# Patient Record
Sex: Female | Born: 1950 | Race: White | Hispanic: No | Marital: Married | Smoking: Never smoker
Health system: Southern US, Community
[De-identification: ages and names within clinical notes are randomized; demographics above are authoritative.]

## PROBLEM LIST (undated history)

## (undated) DIAGNOSIS — IMO0002 Reserved for concepts with insufficient information to code with codable children: Secondary | ICD-10-CM

## (undated) DIAGNOSIS — K219 Gastro-esophageal reflux disease without esophagitis: Secondary | ICD-10-CM

## (undated) DIAGNOSIS — M329 Systemic lupus erythematosus, unspecified: Secondary | ICD-10-CM

## (undated) DIAGNOSIS — E039 Hypothyroidism, unspecified: Secondary | ICD-10-CM

## (undated) DIAGNOSIS — I8500 Esophageal varices without bleeding: Secondary | ICD-10-CM

## (undated) DIAGNOSIS — K7460 Unspecified cirrhosis of liver: Secondary | ICD-10-CM

## (undated) DIAGNOSIS — F419 Anxiety disorder, unspecified: Secondary | ICD-10-CM

## (undated) HISTORY — PX: HEEL SPUR SURGERY: SHX665

## (undated) HISTORY — DX: Anxiety disorder, unspecified: F41.9

## (undated) HISTORY — DX: Gastro-esophageal reflux disease without esophagitis: K21.9

## (undated) HISTORY — PX: CHOLECYSTECTOMY: SHX55

## (undated) HISTORY — PX: ABDOMINAL HYSTERECTOMY: SHX81

## (undated) HISTORY — PX: TONSILLECTOMY: SUR1361

## (undated) HISTORY — PX: HERNIA REPAIR: SHX51

---

## 1998-01-27 ENCOUNTER — Other Ambulatory Visit: Admission: RE | Admit: 1998-01-27 | Discharge: 1998-01-27 | Payer: Self-pay | Admitting: Obstetrics and Gynecology

## 1999-02-09 ENCOUNTER — Encounter: Admission: RE | Admit: 1999-02-09 | Discharge: 1999-02-09 | Payer: Self-pay | Admitting: *Deleted

## 1999-03-11 ENCOUNTER — Other Ambulatory Visit: Admission: RE | Admit: 1999-03-11 | Discharge: 1999-03-11 | Payer: Self-pay | Admitting: Obstetrics and Gynecology

## 1999-03-23 ENCOUNTER — Encounter: Admission: RE | Admit: 1999-03-23 | Discharge: 1999-03-23 | Payer: Self-pay | Admitting: *Deleted

## 2000-02-11 ENCOUNTER — Encounter: Admission: RE | Admit: 2000-02-11 | Discharge: 2000-02-11 | Payer: Self-pay | Admitting: Obstetrics and Gynecology

## 2000-02-11 ENCOUNTER — Encounter: Payer: Self-pay | Admitting: Obstetrics and Gynecology

## 2000-03-14 ENCOUNTER — Other Ambulatory Visit: Admission: RE | Admit: 2000-03-14 | Discharge: 2000-03-14 | Payer: Self-pay | Admitting: Obstetrics and Gynecology

## 2000-11-09 ENCOUNTER — Encounter: Payer: Self-pay | Admitting: Gastroenterology

## 2000-11-09 ENCOUNTER — Ambulatory Visit (HOSPITAL_COMMUNITY): Admission: RE | Admit: 2000-11-09 | Discharge: 2000-11-09 | Payer: Self-pay | Admitting: Gastroenterology

## 2001-03-20 ENCOUNTER — Other Ambulatory Visit: Admission: RE | Admit: 2001-03-20 | Discharge: 2001-03-20 | Payer: Self-pay | Admitting: Obstetrics and Gynecology

## 2002-03-27 ENCOUNTER — Ambulatory Visit (HOSPITAL_COMMUNITY): Admission: RE | Admit: 2002-03-27 | Discharge: 2002-03-27 | Payer: Self-pay | Admitting: Obstetrics and Gynecology

## 2002-03-27 ENCOUNTER — Encounter: Payer: Self-pay | Admitting: Obstetrics and Gynecology

## 2002-04-26 ENCOUNTER — Encounter: Payer: Self-pay | Admitting: Obstetrics and Gynecology

## 2002-04-26 ENCOUNTER — Encounter: Admission: RE | Admit: 2002-04-26 | Discharge: 2002-04-26 | Payer: Self-pay | Admitting: Geriatric Medicine

## 2002-12-05 ENCOUNTER — Ambulatory Visit (HOSPITAL_COMMUNITY): Admission: RE | Admit: 2002-12-05 | Discharge: 2002-12-05 | Payer: Self-pay | Admitting: Obstetrics and Gynecology

## 2003-06-26 ENCOUNTER — Encounter (INDEPENDENT_AMBULATORY_CARE_PROVIDER_SITE_OTHER): Payer: Self-pay | Admitting: *Deleted

## 2003-06-27 ENCOUNTER — Inpatient Hospital Stay (HOSPITAL_COMMUNITY): Admission: RE | Admit: 2003-06-27 | Discharge: 2003-06-28 | Payer: Self-pay | Admitting: Obstetrics and Gynecology

## 2003-10-28 ENCOUNTER — Encounter: Admission: RE | Admit: 2003-10-28 | Discharge: 2003-10-28 | Payer: Self-pay | Admitting: Geriatric Medicine

## 2005-02-10 ENCOUNTER — Encounter: Admission: RE | Admit: 2005-02-10 | Discharge: 2005-02-10 | Payer: Self-pay | Admitting: Obstetrics and Gynecology

## 2006-02-14 ENCOUNTER — Encounter: Admission: RE | Admit: 2006-02-14 | Discharge: 2006-02-14 | Payer: Self-pay | Admitting: Obstetrics and Gynecology

## 2007-03-02 ENCOUNTER — Encounter: Admission: RE | Admit: 2007-03-02 | Discharge: 2007-03-02 | Payer: Self-pay | Admitting: Obstetrics and Gynecology

## 2008-03-11 ENCOUNTER — Encounter: Admission: RE | Admit: 2008-03-11 | Discharge: 2008-03-11 | Payer: Self-pay | Admitting: Geriatric Medicine

## 2008-06-05 ENCOUNTER — Encounter (INDEPENDENT_AMBULATORY_CARE_PROVIDER_SITE_OTHER): Payer: Self-pay | Admitting: General Surgery

## 2008-06-05 ENCOUNTER — Ambulatory Visit (HOSPITAL_COMMUNITY): Admission: RE | Admit: 2008-06-05 | Discharge: 2008-06-05 | Payer: Self-pay | Admitting: General Surgery

## 2008-08-19 ENCOUNTER — Encounter: Admission: RE | Admit: 2008-08-19 | Discharge: 2008-08-19 | Payer: Self-pay | Admitting: Geriatric Medicine

## 2008-11-25 ENCOUNTER — Encounter: Payer: Self-pay | Admitting: Gastroenterology

## 2009-02-10 ENCOUNTER — Encounter: Payer: Self-pay | Admitting: Gastroenterology

## 2009-02-25 ENCOUNTER — Telehealth: Payer: Self-pay | Admitting: Gastroenterology

## 2009-02-27 ENCOUNTER — Encounter (INDEPENDENT_AMBULATORY_CARE_PROVIDER_SITE_OTHER): Payer: Self-pay | Admitting: *Deleted

## 2009-02-27 ENCOUNTER — Ambulatory Visit: Payer: Self-pay | Admitting: Gastroenterology

## 2009-02-27 DIAGNOSIS — K219 Gastro-esophageal reflux disease without esophagitis: Secondary | ICD-10-CM | POA: Insufficient documentation

## 2009-02-27 DIAGNOSIS — E039 Hypothyroidism, unspecified: Secondary | ICD-10-CM | POA: Insufficient documentation

## 2009-02-27 DIAGNOSIS — R1319 Other dysphagia: Secondary | ICD-10-CM

## 2009-02-27 DIAGNOSIS — K8020 Calculus of gallbladder without cholecystitis without obstruction: Secondary | ICD-10-CM | POA: Insufficient documentation

## 2009-02-27 DIAGNOSIS — K589 Irritable bowel syndrome without diarrhea: Secondary | ICD-10-CM

## 2009-02-27 DIAGNOSIS — M329 Systemic lupus erythematosus, unspecified: Secondary | ICD-10-CM | POA: Insufficient documentation

## 2009-02-28 ENCOUNTER — Telehealth: Payer: Self-pay | Admitting: Gastroenterology

## 2009-03-03 ENCOUNTER — Ambulatory Visit: Payer: Self-pay | Admitting: Gastroenterology

## 2009-03-03 LAB — CONVERTED CEMR LAB: UREASE: NEGATIVE

## 2009-03-05 ENCOUNTER — Telehealth (INDEPENDENT_AMBULATORY_CARE_PROVIDER_SITE_OTHER): Payer: Self-pay | Admitting: *Deleted

## 2009-03-06 ENCOUNTER — Encounter: Payer: Self-pay | Admitting: Gastroenterology

## 2009-03-25 ENCOUNTER — Ambulatory Visit: Payer: Self-pay | Admitting: Gastroenterology

## 2009-04-21 ENCOUNTER — Telehealth: Payer: Self-pay | Admitting: Gastroenterology

## 2009-06-04 ENCOUNTER — Telehealth: Payer: Self-pay | Admitting: Gastroenterology

## 2009-10-13 ENCOUNTER — Encounter: Admission: RE | Admit: 2009-10-13 | Discharge: 2009-10-13 | Payer: Self-pay | Admitting: Geriatric Medicine

## 2010-02-07 ENCOUNTER — Encounter: Payer: Self-pay | Admitting: Obstetrics and Gynecology

## 2010-02-19 NOTE — Procedures (Signed)
Summary: Colonoscopy  Patient: Tully Burgo Note: All result statuses are Final unless otherwise noted.  Tests: (1) Colonoscopy (COL)   COL Colonoscopy           DONE     [facility]     [address]     [place], [place]  [address]           COLONOSCOPY PROCEDURE REPORT           PATIENT:  Julia Day, Julia Day  MR#:  [id number]     BIRTHDATE:  May 31, 1950, 58 yrs. old  GENDER:  female           ENDOSCOPIST:  Vania Rea. Jarold Motto, MD, [facility]     Referred by:           PROCEDURE DATE:  03/03/2009     PROCEDURE:  Colonoscopy, Diagnostic     ASA CLASS:  Class II     INDICATIONS:  Routine Risk Screening           MEDICATIONS:   Fentanyl 75 mcg IV, Versed 6 mg IV           DESCRIPTION OF PROCEDURE:   After the risks benefits and     alternatives of the procedure were thoroughly explained, informed     consent was obtained.  Digital rectal exam was performed and     revealed no abnormalities.   The LB CF-H180AL [id number] endoscope     was introduced through the anus and advanced to the cecum, which     was identified by both the appendix and ileocecal valve, without     limitations.  The quality of the prep was good, using MoviPrep.     The instrument was then slowly withdrawn as the colon was fully     examined.     <<PROCEDUREIMAGES>>           FINDINGS:  Mild diverticulosis was found sigmoid to descending  No     polyps or cancers were seen.  External hemorrhoids were found.     This was otherwise a normal examination of the colon.   Retroflexed     views in the rectum revealed no abnormalities.    The scope was     then withdrawn from the patient and the procedure completed.           COMPLICATIONS:  None           ENDOSCOPIC IMPRESSION:     1) Mild diverticulosis in the sigmoid to descending     2) No polyps or cancers     3) External hemorrhoids     4) Otherwise normal examination     RECOMMENDATIONS:     1) high fiber diet     2) Continue current colorectal screening  recommendations for     "routine risk" patients with a repeat colonoscopy in 10 years.           REPEAT EXAM:  No           ______________________________     Vania Rea. Jarold Motto, MD, [facility]           CC:           n.     eSIGNED:   Vania Rea. Issiac Jamar at 03/03/2009 02:31 PM           Latanya Maudlin, [id number]  Note: An exclamation mark (!) indicates a result that was not dispersed into the flowsheet. Document Creation Date: 03/03/2009  2:31 PM _______________________________________________________________________  (1) Order result status: Final Collection or observation date-time: 03/03/2009 14:25 Requested date-time:  Receipt date-time:  Reported date-time:  Referring Physician:   Ordering Physician: Sheryn Bison [id number]) Specimen Source:  Source: Launa Grill Order Number: [id number] Lab site:   Appended Document: Colonoscopy    Clinical Lists Changes  Observations: Added new observation of COLONNXTDUE: 02/2019 (03/03/2009 16:06)      Appended Document: Colonoscopy     Procedures Next Due Date:    Colonoscopy: 02/2019

## 2010-02-19 NOTE — Miscellaneous (Signed)
Summary: clotest  Clinical Lists Changes  Orders: Added new Test order of TLB-H Pylori Screen Gastric Biopsy (83013-CLOTEST) - Signed 

## 2010-02-19 NOTE — Assessment & Plan Note (Signed)
Summary: EPIGASTRIC PAIN, EARLY SATIETY    (MD SWITCH APPROVED)   Julia Day   History of Present Illness Visit Type: Follow-up Visit Primary GI MD: Sheryn Bison MD FACP FAGA Primary Provider: Merlene Laughter, MD Chief Complaint: Patient c/o several years epigastric abdominal pain as well as intermittent GERD symptoms. She states that her GERD symptoms have worsened over the past 1 month. She also states that she has had increased belching and midsternal chest pain x 1 month. There has been dysphagia to both solids and fluids at times. History of Present Illness:   This lady is a very nice 60 year old Caucasian female who's had chronic acid reflux for many years with recent worsening chest pain and intermittent solid food dysphagia along with vague epigastric distress that is relieved by Prilosec 20 mg a day and Align probiotic therapy.  Julia Day had a negative endoscopy and colonoscopy by Dr. Arlyce Dice in 2002. She's had intermittent acid reflux for many years and apparently underwent laprascopic cholecystectomy in May of 2010. Since November of this past year she's had vague epigastric discomfort, reflux symptoms, coughing, and mild early satiety not responsive to H2 blocker therapy. She is on daily aspirin and uses frequent NSAIDs. She has some lower abdominal cramping, gas and bloating but denies melena or hematochezia, anorexia, weight loss, or any systemic complaints. She also denies recurrent hepatobiliary problems. She is self prescribed Prilosec and probiotics with some improvement over the last week.  She relates she had extensive lab tests by Dr.Stoneking in November and we will request these labs for review. She denies any history of hepatitis or pancreatitis. She does have intermittent solid food dysphagia. In addition to her cholecystectomy she has had tonsillectomy, hysterectomy, repair of an umbilical hernia, abdominoplasty, and foot surgery. She denies abuse of alcohol or cigarettes. Family  history is noncontributory. She does have multiple drug allergies.   GI Review of Systems    Reports abdominal pain, acid reflux, belching, chest pain, dysphagia with liquids, dysphagia with solids, heartburn, and  nausea.     Location of  Abdominal pain: epigastric area.    Denies bloating, loss of appetite, vomiting, vomiting blood, weight loss, and  weight gain.        Denies anal fissure, black tarry stools, change in bowel habit, constipation, diarrhea, diverticulosis, fecal incontinence, heme positive stool, hemorrhoids, irritable bowel syndrome, jaundice, light color stool, liver problems, rectal bleeding, and  rectal pain. Preventive Screening-Counseling & Management  Alcohol-Tobacco     Smoking Status: never      Drug Use:  no.      Current Medications (verified): 1)  Synthroid 75 Mcg Tabs (Levothyroxine Sodium) .... Take 1 Tablet By Mouth Once A Day 2)  Tramadol Hcl 50 Mg Tabs (Tramadol Hcl) .... Take 1 Tablet By Mouth Once A Day 3)  Centrum Silver  Tabs (Multiple Vitamins-Minerals) .... Take 1 Tablet By Mouth Once Daily 4)  Citracal Plus  Tabs (Multiple Minerals-Vitamins) .... Take 1 Tablet By Mouth Once A Day 5)  Milk of Magnesia (Unknown Dosage) .... Take 2 Tablets By Mouth Once Daily 6)  Prilosec Otc 20 Mg Tbec (Omeprazole Magnesium) .... Take 1 Tablet By Mouth As Needed 7)  Align  Caps (Probiotic Product) .... Take 1 Tablet By Mouth Once A Day 8)  Zantac 150 Mg Tabs (Ranitidine Hcl) .... Take 1 Tablet By Mouth As Needed 9)  Advil 200 Mg Tabs (Ibuprofen) .... Take 1 Tablet By Mouth As Needed 10)  Aspirin 325 Mg Tabs (Aspirin) .Marland KitchenMarland KitchenMarland Kitchen  Take 1 Tablet By Mouth As Needed  Allergies (verified): 1)  ! Sulfa 2)  ! Erythromycin  Past History:  Past medical, surgical, family and social histories (including risk factors) reviewed for relevance to current acute and chronic problems.  Past Medical History: Reviewed history from 02/26/2009 and no changes required. Macular  Degeneration Thyroid Disease  Past Surgical History: Tonsillectomy Right hand reconstruction Tummy Tuck Cholecystectomy Hysterectomy periumbilical herniograpphy abdominoplasty Right foot surgery x 3  Family History: Reviewed history from 02/26/2009 and no changes required. Family History of Colon Polyps: Mother Family History of Heart Disease:  Father Melanoma: Mother No FH of Colon Cancer: Family History of Diabetes: Aunt  Social History: Reviewed history and no changes required. 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  Alcohol Use - no Illicit Drug Use - no Smoking Status:  never Drug Use:  no  Review of Systems       The patient complains of back pain, fatigue, and sleeping problems.  The patient denies allergy/sinus, anemia, anxiety-new, arthritis/joint pain, blood in urine, breast changes/lumps, change in vision, confusion, cough, coughing up blood, depression-new, fainting, fever, headaches-new, hearing problems, heart murmur, heart rhythm changes, itching, menstrual pain, muscle pains/cramps, night sweats, nosebleeds, pregnancy symptoms, shortness of breath, skin rash, sore throat, swelling of feet/legs, swollen lymph glands, thirst - excessive, urination - excessive, urination changes/pain, urine leakage, vision changes, and voice change.   General:  Complains of fatigue and sleep disorder; denies fever, chills, sweats, anorexia, weakness, malaise, and weight loss. Eyes:  Denies blurring, diplopia, irritation, discharge, vision loss, scotoma, eye pain, and photophobia. ENT:  Denies earache, ear discharge, tinnitus, decreased hearing, nasal congestion, loss of smell, nosebleeds, sore throat, hoarseness, and difficulty swallowing. CV:  Denies chest pains, angina, palpitations, syncope, dyspnea on exertion, orthopnea, PND, peripheral edema, and claudication. Resp:  Denies dyspnea at rest, dyspnea with exercise, cough, sputum, wheezing, coughing up blood, and  pleurisy. GI:  Complains of difficulty swallowing, indigestion/heartburn, abdominal pain, gas/bloating, and change in bowel habits; denies pain on swallowing, nausea, vomiting, vomiting blood, jaundice, diarrhea, constipation, bloody BM's, black BMs, and fecal incontinence. GU:  Denies urinary burning, blood in urine, nocturnal urination, urinary frequency, urinary incontinence, abnormal vaginal bleeding, amenorrhea, menorrhagia, vaginal discharge, pelvic pain, genital sores, painful intercourse, and decreased libido. MS:  Complains of joint stiffness and low back pain; denies joint pain / LOM, joint swelling, joint deformity, muscle weakness, muscle cramps, muscle atrophy, leg pain at night, leg pain with exertion, and shoulder pain / LOM hand / wrist pain (CTS); she has chronic foot pain and is on p.r.n. tramadol.. Derm:  Denies rash, itching, dry skin, hives, moles, warts, and unhealing ulcers. Neuro:  Denies weakness, paralysis, abnormal sensation, seizures, syncope, tremors, vertigo, transient blindness, frequent falls, frequent headaches, difficulty walking, headache, sciatica, radiculopathy other:, restless legs, memory loss, and confusion. Psych:  Denies depression, anxiety, memory loss, suicidal ideation, hallucinations, paranoia, phobia, and confusion. Endo:  Denies cold intolerance, heat intolerance, polydipsia, polyphagia, polyuria, unusual weight change, and hirsutism; she is on Synthroid 75 micrograms a day for chronic thyroid dysfunction.. Heme:  Denies bruising, bleeding, enlarged lymph nodes, and pagophagia. Allergy:  Denies hives, rash, sneezing, hay fever, and recurrent infections.  Vital Signs:  Patient profile:   60 year old female Height:      65 inches Weight:      150.25 pounds BMI:     25.09 BSA:     1.75 Pulse rate:   80 / minute Pulse rhythm:   regular  BP sitting:   134 / 84  (right arm)  Vitals Entered By: Hortense Ramal CMA Duncan Dull) (February 27, 2009 11:26  AM)  Physical Exam  General:  Well developed, well nourished, no acute distress.healthy appearing.   Head:  Normocephalic and atraumatic. Eyes:  PERRLA, no icterus.exam deferred to patient's ophthalmologist.   Mouth:  No deformity or lesions, dentition normal. Neck:  Supple; no masses or thyromegaly. Lungs:  Clear throughout to auscultation. Heart:  Regular rate and rhythm; no murmurs, rubs,  or bruits. Abdomen:  Soft, nontender and nondistended. No masses, hepatosplenomegaly or hernias noted. Normal bowel sounds. Rectal:  deferred until time of colonoscopy.   Msk:  Symmetrical with no gross deformities. Normal posture. Pulses:  Normal pulses noted. Extremities:  No clubbing, cyanosis, edema or deformities noted. Neurologic:  Alert and  oriented x4;  grossly normal neurologically. Skin:  Intact without significant lesions or rashes. Cervical Nodes:  No significant cervical adenopathy. Axillary Nodes:  No significant axillary adenopathy. Inguinal Nodes:  No significant inguinal adenopathy. Psych:  Alert and cooperative. Normal mood and affect.   Impression & Recommendations:  Problem # 1:  SYSTEMIC LUPUS ERYTHEMATOSUS (ICD-710.0) Assessment Improved Apparently she was on corticosteroid therapy some 20-30 years ago. She has no symptoms of active collagen-vascular disease at this time and denies Raynaud"s phenomenon.  Problem # 2:  IRRITABLE BOWEL SYNDROME (ICD-564.1) Assessment: Unchanged  colonoscopy followup scheduled at her convenience.  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 Have scheduled her for endoscopy, dilation, and biopsies to exclude H. pylori. A reflex regime is been reviewed and she has been changed to Dexilant 60 mg before the first meal of the day. Depending on her workup and clinical course she may need manometry and 24-hour  pH probe testing.  Orders: Colon/Endo (Colon/Endo)  Problem # 4:  HYPOTHYROIDISM (ICD-244.9) Assessment: Improved Continue Other Medications as per primary care labs for review has been requested from Dr.Stoneking.  Patient Instructions: 1)  Copy sent to : Dr.Hal Stoneking 2)  Dexilant 60 mg 30 minutes before breakfast. 3)  Avoid foods high in acid content ( tomatoes, citrus juices, spicy foods) . Avoid eating within 3 to 4 hours of lying down or before exercising. Do not over eat; try smaller more frequent meals. Elevate head of bed four inches when sleeping.  4)  Diet should be high in fiber ( fruits, vegetables, whole grains) but low in residue. Drink at least eight (8) glasses of water a day.  5)  Colonoscopy and Flexible Sigmoidoscopy brochure given.  6)  Conscious Sedation brochure given.  7)  Upper Endoscopy brochure given.  8)  Please continue current medications.  Prescriptions: MOVIPREP 100 GM  SOLR (PEG-KCL-NACL-NASULF-NA ASC-C) As per prep instructions.  #1 x 0   Entered by:   Ashok Cordia RN   Authorized by:   Mardella Layman MD [facility]   Signed by:   Ashok Cordia RN on 02/27/2009   Method used:   Electronically to        [facility]  [address] [phone]* (retail)       [address]       [place], [place]  [address]       Ph: [phone] or [phone]       Fax: [phone]   RxID:   [id number] DEXILANT 60 MG CPDR (DEXLANSOPRAZOLE)  1 by mouth q am  #30 x 6   Entered by:   Ashok Cordia RN   Authorized by:   Mardella Layman MD [facility]   Signed by:   Ashok Cordia RN on 02/27/2009   Method used:   Electronically to        [facility]  [address] [phone] (retail)       [address]       [place], [place]  [address]       Ph: [phone] or [phone]       Fax: [phone]   RxID:   [id number]

## 2010-02-19 NOTE — Progress Notes (Signed)
Summary: Possible allergic reaction   Phone Note Call from Patient Call back at Home Phone [phone] Call back at or cell [phone]   Call For: Dr Jarold Motto Reason for Call: Talk to Nurse Summary of Call: Possible allergic reaction to Dexilant Initial call taken by: Leanor Kail [facility],  April 21, 2009 12:25 PM  Follow-up for Phone Call        Pt has been taking Dexilant since [facility] February..  Started with rash on March 23rd.  Rash on upper chest and neck.  Itches at times,. Rash is same as when she had a reaction to EES.  Dexilant is the only thing she has done differently.  it has worked great for her symptoms.  Pt stopped taking it 3 days ago.  rashis about the same.  Follow-up by: Ashok Cordia RN,  April 21, 2009 12:48 PM  Additional Follow-up for Phone Call Additional follow up Details #1::        would be unusual for a PPi...needs to check i care Additional Follow-up by: Mardella Layman MD [facility],  April 21, 2009 12:52 PM

## 2010-02-19 NOTE — Procedures (Signed)
Summary: Upper Endoscopy  Patient: [name] Note: All result statuses are Final unless otherwise noted.  Tests: (1) Upper Endoscopy (EGD)   EGD Upper Endoscopy       DONE     [facility]     [address]     [place], [place]  [address]           ENDOSCOPY PROCEDURE REPORT           PATIENT:  Julia Day, Julia Day  MR#:  [id number]     BIRTHDATE:  09-29-1950, 58 yrs. old  GENDER:  female           ENDOSCOPIST:  Vania Rea. Jarold Motto, MD, [facility]     Referred by:           PROCEDURE DATE:  03/03/2009     PROCEDURE:  EGD with biopsy, Elease Hashimoto Dilation of Esophagus     ASA CLASS:  Class II     INDICATIONS:  dysphagia, GERD           MEDICATIONS:   Fentanyl 25 mcg IV, Versed 2 mg IV, glycopyrrolate     (Robinal) 0.2 mg IV     TOPICAL ANESTHETIC:  Exactacain Spray           DESCRIPTION OF PROCEDURE:   After the risks benefits and     alternatives of the procedure were thoroughly explained, informed     consent was obtained.  The LB GIF-H180 [id number] and Pentax [id number]     endoscope was introduced through the mouth and advanced to the     second portion of the duodenum, without limitations.  The     instrument was slowly withdrawn as the mucosa was fully examined.     <<PROCEDUREIMAGES>>           Normal duodenal folds were noted. small bowel biopsy done  The     esophagus and gastroesophageal junction were completely normal in     appearance.  The stomach was entered and closely examined. The     antrum, angularis, and lesser curvature were well visualized,     including a retroflexed view of the cardia and fundus. The stomach     wall was normally distensable. The scope passed easily through the     pylorus into the duodenum. clo bx. done  Normal GE junction was     noted. EMPIRIC DILATION #61f MAloney dilator passed easily.     Retroflexed views revealed no abnormalities.    The scope was then     withdrawn from the patient and the procedure completed.        COMPLICATIONS:  None           ENDOSCOPIC IMPRESSION:     1) Normal duodenal folds     2) Normal esophagus     3) Normal stomach     4) Normal GE junction     PROBABLE CHRONIC GERD AND OCCULT STRICTURE DILATED.     RECOMMENDATIONS:     1) await biopsy results     2) continue PPI     3) post dilation instructions           REPEAT EXAM:  No           ______________________________     Vania Rea. Jarold Motto, MD, [facility]           CC:  Merlene Laughter, MD           n.  eSIGNED:   Vania Rea. Patterson at 03/03/2009 02:46 PM           Latanya Maudlin, [id number]  Note: An exclamation mark (!) indicates a result that was not dispersed into the flowsheet. Document Creation Date: 03/03/2009 2:46 PM _______________________________________________________________________  (1) Order result status: Final Collection or observation date-time: 03/03/2009 14:37 Requested date-time:  Receipt date-time:  Reported date-time:  Referring Physician:   Ordering Physician: Sheryn Bison [phone]) Specimen Source:  Source: Launa Grill Order Number: [id number] Lab site:

## 2010-02-19 NOTE — Assessment & Plan Note (Signed)
Summary: 3WK FU/YF   History of Present Illness Visit Type: Follow-up Visit Primary GI MD: Sheryn Bison MD FACP FAGA Primary Provider: Merlene Laughter, MD Requesting Provider: n/a Chief Complaint: F/u from endo and colon. Pt states that she is fine and denies any GI complaints History of Present Illness:   Her chest pain is remarkably improved on daily PPI therapy. She denies dysphasia after esophageal dilatation. Colonoscopy also was unremarkable, and she is much better on Phillip's Colon Health capsules twice a day. 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 She denies any hepatobiliary complaints. She is on thyroid replacement, aspirin, calcium replacement, and daily tramadol.   GI Review of Systems      Denies abdominal pain, acid reflux, belching, bloating, chest pain, dysphagia with liquids, dysphagia with solids, heartburn, loss of appetite, nausea, vomiting, vomiting blood, weight loss, and  weight gain.        Denies anal fissure, black tarry stools, change in bowel habit, constipation, diarrhea, diverticulosis, fecal incontinence, heme positive stool, hemorrhoids, irritable bowel syndrome, jaundice, light color stool, liver problems, rectal bleeding, and  rectal pain.    Current Medications (verified): 1)  Synthroid 75 Mcg Tabs (Levothyroxine Sodium) .... Take 1 Tablet By Mouth Once A Day 2)  Tramadol Hcl 50 Mg Tabs (Tramadol Hcl) .... Take 1 Tablet By Mouth Once A Day 3)  Centrum Silver  Tabs (Multiple Vitamins-Minerals) .... Take 1 Tablet By Mouth Once Daily 4)  Citracal Plus  Tabs (Multiple Minerals-Vitamins) .... Take 1 Tablet By Mouth Two Times A Day 5)  Milk of Magnesia (Unknown Dosage) .... Take 2 Tablets By Mouth Once Daily 6)  Advil 200 Mg Tabs (Ibuprofen) .... Take 1 Tablet By Mouth As Needed 7)  Aspirin 325 Mg Tabs (Aspirin) .... Take 1 Tablet By Mouth As Needed 8)   Dexilant 60 Mg Cpdr (Dexlansoprazole) .Marland Kitchen.. 1 By Mouth Q Am 9)  Phillips Colon Health  Caps (Probiotic Product) .... One Capsule By Mouth Once Daily  Allergies (verified): 1)  ! Sulfa 2)  ! 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 Past medical, surgical, family and social histories (including risk factors) reviewed for relevance to current acute and chronic problems.  Past Medical History: Reviewed history from 02/26/2009 and no changes required. Macular Degeneration Thyroid Disease  Past Surgical History: Reviewed history from 02/27/2009 and no changes required. Tonsillectomy Right hand reconstruction Tummy Tuck Cholecystectomy Hysterectomy periumbilical herniograpphy abdominoplasty Right foot surgery x 3  Family History: Reviewed history from 02/27/2009 and no changes required. Family History of Colon Polyps: Mother Family History of Heart Disease:  Father Melanoma: Mother No FH of Colon Cancer: Family History of Diabetes: Aunt  Social History: Reviewed history from 02/27/2009 and no changes required. 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  Alcohol Use - no Illicit Drug Use - no  Review of Systems       The patient complains of arthritis/joint pain.  The patient denies allergy/sinus, anemia, anxiety-new, back pain, blood in urine, breast changes/lumps, change in vision, confusion, cough, coughing up blood, depression-new, fainting, fatigue, fever, headaches-new, hearing problems, heart murmur, heart rhythm changes, itching, menstrual pain, muscle pains/cramps, night sweats, nosebleeds, pregnancy symptoms, shortness of breath, skin rash, sleeping problems, sore throat, swelling of feet/legs, swollen lymph glands, thirst - excessive , urination - excessive , urination changes/pain, urine leakage, vision changes, and voice  change.    Vital Signs:  Patient profile:   60 year old female Height:       65 inches Weight:      151 pounds BMI:     25.22 BSA:     1.76 Pulse rate:   76 / minute Pulse rhythm:   regular BP sitting:   132 / 80  (left arm) Cuff size:   regular  Vitals Entered By: Ok Anis CMA (March 25, 2009 9:49 AM)  Physical Exam  General:  Well developed, well nourished, no acute distress.healthy appearing.   Head:  Normocephalic and atraumatic. Eyes:  PERRLA, no icterus.exam deferred to patient's ophthalmologist.   Psych:  Alert and cooperative. Normal mood and affect.   Impression & Recommendations:  Problem # 1:  DYSPHAGIA (JXB-147.82) Assessment Improved Continue reflux regime and daily PPI therapy. She may need periodic dilations. She gives no history of Raynaud's phenomenon.  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 I will ask her primary care physician to send [place] a copy of her liver enzymes.  Problem # 3:  IRRITABLE BOWEL SYNDROME (ICD-564.1) Assessment: Improved Continue probiotics and fiber supplements.  Patient Instructions: 1)  Please continue current medications.  2)  GI Reflux brochure given.  3)  The medication list was reviewed and reconciled.  All changed / newly prescribed medications were explained.  A complete medication list was provided to the patient / caregiver. 4)  Avoid foods high in acid (tomatoes, citrus juices, spicy foods). Avoid eating within two hours of lying down or before exercising. Do not over eat; try smaller more frequent meals. Elevate head of bed twelve inches when sleeping.

## 2010-02-19 NOTE — Letter (Signed)
Summary: [facility] Instructions  [facility]  [address] [place], [place] [address]   Phone: [phone]  Fax: [phone]       Julia Day    09-Feb-1950    MRN: [id number]        Procedure Day /Date: Monday, 03/03/09     Arrival Time: 1:00      Procedure Time 2:00     Location of Procedure:                    _X _  [facility] (4th Floor)    PREPARATION FOR COLONOSCOPY WITH MOVIPREP   Starting today do not eat nuts, seeds, popcorn, corn, beans, peas,  salads, or any raw vegetables.  Do not take any fiber supplements (e.g. Metamucil, Citrucel, and Benefiber).  THE DAY BEFORE YOUR PROCEDURE         DATE: 03/02/09  DAY: Sunday  1.  Drink clear liquids the entire day-NO SOLID FOOD  2.  Do not drink anything colored red or purple.  Avoid juices with pulp.  No orange juice.  3.  Drink at least 64 oz. (8 glasses) of fluid/clear liquids during the day to prevent dehydration and help the prep work efficiently.  CLEAR LIQUIDS INCLUDE: Water Jello Ice Popsicles Tea (sugar ok, no milk/cream) Powdered fruit flavored drinks Coffee (sugar ok, no milk/cream) Gatorade Juice: apple, white grape, white cranberry  Lemonade Clear bullion, consomm, broth Carbonated beverages (any kind) Strained chicken noodle soup Hard Candy                             4.  In the morning, mix first dose of MoviPrep solution:    Empty 1 Pouch A and 1 Pouch B into the disposable container    Add lukewarm drinking water to the top line of the container. Mix to dissolve    Refrigerate (mixed solution should be used within 24 hrs)  5.  Begin drinking the prep at 5:00 p.m. The MoviPrep container is divided by 4 marks.   Every 15 minutes drink the solution down to the next mark (approximately 8 oz) until the full liter is complete.   6.  Follow completed prep with 16 oz of clear liquid of your choice (Nothing red or purple).  Continue to drink clear liquids until  bedtime.  7.  Before going to bed, mix second dose of MoviPrep solution:    Empty 1 Pouch A and 1 Pouch B into the disposable container    Add lukewarm drinking water to the top line of the container. Mix to dissolve    Refrigerate  THE DAY OF YOUR PROCEDURE      DATE: 03/03/09  DAY: Monday  Beginning at 9"00 a.m. (5 hours before procedure):         1. Every 15 minutes, drink the solution down to the next mark (approx 8 oz) until the full liter is complete.  2. Follow completed prep with 16 oz. of clear liquid of your choice.    3. You may drink clear liquids until 12:00  (2 HOURS BEFORE PROCEDURE).   MEDICATION INSTRUCTIONS  Unless otherwise instructed, you should take regular prescription medications with a small sip of water   as early as possible the morning of your procedure.                 OTHER INSTRUCTIONS  You will need a responsible  adult at least 60 years of age to accompany you and drive you home.   This person must remain in the waiting room during your procedure.  Wear loose fitting clothing that is easily removed.  Leave jewelry and other valuables at home.  However, you may wish to bring a book to read or  an iPod/MP3 player to listen to music as you wait for your procedure to start.  Remove all body piercing jewelry and leave at home.  Total time from sign-in until discharge is approximately 2-3 hours.  You should go home directly after your procedure and rest.  You can resume normal activities the  day after your procedure.  The day of your procedure you should not:   Drive   Make legal decisions   Operate machinery   Drink alcohol   Return to work  You will receive specific instructions about eating, activities and medications before you leave.    The above instructions have been reviewed and explained to me by   _______________________    I fully understand and can verbalize these instructions _____________________________  Date _________

## 2010-02-19 NOTE — Progress Notes (Signed)
Summary: Prep for Procedure Monday  Phone Note Call from Patient Call back at Home Phone [phone] Call back at Cell [phone]   Caller: Patient Call For: Dr. Jarold Motto Reason for Call: Talk to Nurse Summary of Call: Pt. is having problems getting her Dexilant for her procedure on Monday. It is too expensive and Ins. will not cover it. Initial call taken by: Karna Christmas,  February 28, 2009 3:53 PM  Follow-up for Phone Call        Samples of Dexilant given to pt.  Pt to have proc done next week.   Follow-up by: Ashok Cordia RN,  February 28, 2009 4:16 PM    New/Updated Medications: DEXILANT 60 MG CPDR (DEXLANSOPRAZOLE) 1 by mouth q am

## 2010-02-19 NOTE — Procedures (Signed)
Summary: Colon   Colonoscopy  Procedure date:  11/09/2000  Findings:      Location:  [facility].   Patient Name: Julia Day, Julia Day MRN: [id number] Procedure Procedures: Colorectal cancer screening, average risk CPT: G0121.  Personnel: Endoscopist: Barbette Hair. Arlyce Dice, MD.  Referred By: Sunday Spillers. Stoneking, MD.  Exam Location: Exam performed in Endoscopy Suite.  Patient Consent: Procedure, Alternatives, Risks and Benefits discussed, consent obtained,  Indications Symptoms: Constipation  Increased Risk Screening: Family History of Polyps.  History  Pre-Exam Physical: Performed Nov 09, 2000. Cardio-pulmonary exam, Rectal exam, HEENT exam , Abdominal exam, Extremity exam, Neurological exam, Mental status exam WNL.  Exam Exam: Extent of exam reached: Cecum, extent intended: Cecum.  ASA Classification: I. Tolerance: good.  Monitoring: Pulse and BP monitoring, Oximetry used. Supplemental O2 given.  Colon Prep Used Golytely for colon prep. Prep results: good.  Fluoroscopy: Fluoroscopy was not used.  Sedation Meds: Fentanyl 90 Versed 9 mg.  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   Assessment Abnormal examination, see findings above.  Events  Unplanned Interventions: No intervention was required.  Unplanned Events: There were no complications. Plans Medication Plan: Fiber supplements: Methylcellulose 1 tsp QD, starting Nov 09, 2000   Disposition: After procedure patient sent to recovery.  Scheduling/Referral: Office Visit, to [employer]. Arlyce Dice, MD, around Dec 21, 2000.    cc: Hal T. Stoneking, MD   This report was created from the original endoscopy report, which was reviewed and signed by the above listed endoscopist.

## 2010-02-19 NOTE — Letter (Signed)
Summary: Patient [facility] Biopsy Results  [facility]  [address] [place], [place] [address]   Phone: [phone]  Fax: [phone]        March 06, 2009 MRN: [id number]    [facility] [address] [place], [place]  [address]    Dear Ms. Hendriks,  I am pleased to inform you that the biopsies taken during your recent endoscopic examination did not show any evidence of cancer upon pathologic examination.  Additional information/recommendations:  __No further action is needed at this time.  Please follow-up with      your primary care physician for your other healthcare needs.  __ Please call [phone] to schedule a return visit to review      your condition.  _x_ Continue with the treatment plan as outlined on the day of your      exam.  __ You should have a repeat endoscopic examination for this problem              in _ months/years.   Please call us if you are having persistent problems or have questions about your condition that have not been fully answered at this time.  Sincerely,  Mardella Layman MD [facility]  This letter has been electronically signed by your physician.  Appended Document: Patient Notice-Endo Biopsy Results Letter mailed 2.18.11

## 2010-02-19 NOTE — Progress Notes (Signed)
Summary: Triage  Phone Note Call from Patient   Caller: Patient Call For: Dr. Jarold Motto Reason for Call: Talk to Nurse Summary of Call: pt. would like to discuss her Dexilant med. Initial call taken by: Karna Christmas,  Jun 04, 2009 2:29 PM  Follow-up for Phone Call        Pt developed a rash which she thought was from Dexilant, had Ov with her PCP and it was decided that Dexilant did not cause the rash.  Pt has resumed the dexilant.  Asking if we have any samples.  Samples given to pt.  Follow-up by: Ashok Cordia RN,  Jun 04, 2009 2:57 PM    New/Updated Medications: DEXILANT 60 MG CPDR (DEXLANSOPRAZOLE) 1 by mouth q am

## 2010-02-19 NOTE — Medication Information (Signed)
Summary: Approved/[employer]  Approved/[employer]   Imported By: Lester Kingwood 03/10/2009 08:56:19  _____________________________________________________________________  External Attachment:    Type:   Image     Comment:   External Document

## 2010-02-19 NOTE — Progress Notes (Signed)
Summary: MD Switch Request  Phone Note Call from Patient Call back at Home Phone [phone]   Caller: Patient Call For: Arlyce Dice Reason for Call: Talk to Nurse Summary of Call: Patient wants to switch from Dr Arlyce Dice to Dr Jarold Motto just because that's who she would ilke to see, states that she does not remember Dr Arlyce Dice an would like to start new. (last time she was here was in 2002 and had procedure by Dr. Arlyce Dice) Initial call taken by: Tawni Levy,  February 25, 2009 8:55 AM  Follow-up for Phone Call        DR.Oluwaferanmi Wain--Do you approve the switch? Follow-up by: Laureen Ochs LPN,  February 25, 2009 9:10 AM  Additional Follow-up for Phone Call Additional follow up Details #1::        ok Additional Follow-up by: Louis Meckel MD,  February 25, 2009 9:16 AM    Additional Follow-up for Phone Call Additional follow up Details #2::    DR.PATTERSON-Will you accept this patient? Follow-up by: Laureen Ochs LPN,  February 25, 2009 9:18 AM  Additional Follow-up for Phone Call Additional follow up Details #3:: Details for Additional Follow-up Action Taken: I need to review her records first. Additional Follow-up by: Mardella Layman MD [facility],  February 25, 2009 12:17 PM  Chart has been requested, I will give to Dr.Patterson for review.Laureen Ochs LPN  February 25, 2009 12:32 PM  Dr.Patterson--The chart is on your desk, please review and advise. Laureen Ochs LPN  February 25, 2009 1:41 PM  Appended Document: MD Switch Request Dr.Patterson accepted pt. She will see him on 02-27-09 at 11:15am.

## 2010-02-19 NOTE — Progress Notes (Signed)
Summary: Needs follow up appt  Phone Note Outgoing Call   Call placed by: Ashok Cordia RN,  March 05, 2009 10:34 AM Summary of Call: Lm for pt to call.  Needs to sch Follow up appt.  2-3 weeks after proc of 03/03/09. Initial call taken by: Ashok Cordia RN,  March 05, 2009 10:35 AM  Follow-up for Phone Call        Spoke to patient and according to her schedule she can come for a return visit on 03-25-09 9:45am. Adviced to call us for a sooner appoinment or if she had any problems at all. Follow-up by: Leanor Kail [facility],  March 06, 2009 1:59 PM

## 2010-04-06 ENCOUNTER — Telehealth: Payer: Self-pay | Admitting: Gastroenterology

## 2010-04-16 NOTE — Progress Notes (Signed)
Summary: Samples of Dexilant  Phone Note Call from Patient Call back at Home Phone [phone] Call back at or cell [phone]   Call For: Dr Jarold Motto Summary of Call: Samples of dexilant  Initial call taken by: Leanor Kail [facility],  April 06, 2010 10:35 AM  Follow-up for Phone Call        due to cost she wants to change to Omeprazole. she will be on dexilant one more month and change to Omeprazole.  Follow-up by: Harlow Mares CMA [facility]),  April 06, 2010 10:49 AM    New/Updated Medications: OMEPRAZOLE 40 MG CPDR (OMEPRAZOLE) take one by mouth once daily DEXILANT 60 MG CPDR (DEXLANSOPRAZOLE) take one by mouth once daily Prescriptions: DEXILANT 60 MG CPDR (DEXLANSOPRAZOLE) take one by mouth once daily  #30 x 0   Entered by:   Harlow Mares CMA (AAMA)   Authorized by:   Mardella Layman MD [facility]   Signed by:   Harlow Mares CMA (AAMA) on 04/06/2010   Method used:   Electronically to        [facility]  [address] [phone]* (retail)       [address]       [place], [place]  [address]       Ph: [phone] or [phone]       Fax: [phone]   RxID:   [id number] OMEPRAZOLE 40 MG CPDR (OMEPRAZOLE) take one by mouth once daily  #30 x 6   Entered by:   Harlow Mares CMA (AAMA)   Authorized by:   Mardella Layman MD [facility]   Signed by:   Harlow Mares CMA (AAMA) on 04/06/2010   Method used:   Electronically to        [facility]  [address] [phone]* (retail)       [address]       [place], [place]  [address]       Ph: [phone] or [phone]       Fax: [phone]   RxID:   [id number]

## 2010-04-28 LAB — COMPREHENSIVE METABOLIC PANEL
ALT: 28 U/L (ref 0–35)
AST: 34 U/L (ref 0–37)
Albumin: 3.8 g/dL (ref 3.5–5.2)
Alkaline Phosphatase: 91 U/L (ref 39–117)
Chloride: 105 mEq/L (ref 96–112)
Creatinine, Ser: 0.59 mg/dL (ref 0.4–1.2)
GFR calc Af Amer: >60 mL/min (ref >60)
Potassium: 4.3 mEq/L (ref 3.5–5.1)
Sodium: 141 mEq/L (ref 135–145)
Total Bilirubin: 0.6 mg/dL (ref 0.3–1.2)

## 2010-04-28 LAB — DIFFERENTIAL
Basophils Absolute: 0 10*3/uL (ref 0.0–0.1)
Eosinophils Absolute: 0.1 10*3/uL (ref 0.0–0.7)
Eosinophils Relative: 3 % (ref 0–5)
Lymphocytes Relative: 29 % (ref 12–46)
Monocytes Absolute: 0.4 10*3/uL (ref 0.1–1.0)

## 2010-04-28 LAB — CBC
Platelets: 178 10*3/uL (ref 150–400)
WBC: 5.2 10*3/uL (ref 4.0–10.5)

## 2010-06-02 NOTE — Op Note (Signed)
NAMESAMORIA, FEDORKO               ACCOUNT NO.:  [id number]   MEDICAL RECORD NO.:  [id number]          PATIENT TYPE:  AMB   LOCATION:  DAY                          FACILITY:  [facility]   PHYSICIAN:  Adolph Pollack, M.D.DATE OF BIRTH:  [birth year]   DATE OF PROCEDURE:  06/05/2008  DATE OF DISCHARGE:                               OPERATIVE REPORT   PREOPERATIVE DIAGNOSIS:  Symptomatic cholelithiasis.   POSTOPERATIVE DIAGNOSIS:  Symptomatic cholelithiasis.   PROCEDURE:  Laparoscopic cholecystectomy with intraoperative  cholangiogram.   SURGEON:  Adolph Pollack, M.D.   ANESTHESIA:  General.   INDICATIONS:  Ms. Broerman is a 60 year old female who has had a couple  episodes of biliary colic-type symptoms and has gallstones.  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  She  now presents for elective cholecystectomy.  The procedure risks and  aftercare been discussed with her preoperatively.   TECHNIQUE:  She was brought to the operating room, placed supine on the  operating table and general anesthetic was administered.  The abdominal  wall was sterilely prepped and draped.  In the right upper quadrant, 2  fingerbreadths below the costal margin and the anterior axillary line, a  small incision was made and using the 5-mm Optiview trocar I gained  access to the peritoneal cavity under direct laparoscopic vision and  created a pneumoperitoneum.  The laparoscope did not demonstrate any  injury to the bowel or significant bleeding.  Following this, I then  made a supraumbilical curvilinear incision and placed a 10-mm trocar  into the abdominal cavity through a supraumbilical incision.  She was  placed in reverse Trendelenburg position with the right side tilted  slightly up.  An 11-mm trocar was placed through an epigastric incision,  a 5-mm trocar placed in the right lower quadrant  midclavicular line.   The gallbladder was identified.  There were thin adhesions between the  gallbladder fundus and body omentum, and these were stripped down  bluntly.  The fundus of the gallbladder was retracted toward the right  shoulder.  The infundibulum was then mobilized using blunt dissection  close to the gallbladder.  The infundibulum was then retracted  laterally.  The cystic duct was identified and a window created around  it.  I also identified an anterior and posterior branch of the cystic  artery close to the gallbladder.  Windows were created around these.  The critical view was achieved.   A clip was then placed at the cystic duct/gallbladder junction and a  small incision made in the cystic duct.  I milked the cystic duct from  the biliary side to the hepatic side and extracted a small stone which  was removed from the abdominal cavity.   A cholangiocath was then passed through the abdominal wall, placed into  the cystic duct and a cholangiogram was performed.   Under real time fluoroscopy, dilute contrast was injected into the  cystic duct which was of moderate to long length.  The common hepatic,  right and  left hepatic, common bile ducts all filled promptly and  contrast  drained promptly to the duodenum without obvious evidence of  obstruction.  Final reports pending the radiologist's interpretation.   The cholangiocatheter was removed, the cystic duct was clipped 3 times  on the biliary side and then divided.  I then clipped and divided both  anterior and posterior branches of  the cystic artery.   The gallbladder was dissected free from liver using electrocautery.  There was a small to moderate amount of spillage of bile, but no stone  spillage.  The gallbladder was placed in an Endopouch bag.   I then copiously irrigated out the gallbladder fossa and bleeding points  were controlled with electrocautery.  Further inspection demonstrated no  evidence of  bleeding or bile leak.  The fluid was evacuated.  Surgicel  was placed in the gallbladder fossa.   The gallbladder in the Endopouch bag was then removed through the  supraumbilical incision.  The remaining trocars were removed and  pneumoperitoneum was released.   Skin incisions were closed with 4-0 Monocryl subcuticular stitches,  followed by Steri-Strips and sterile dressings.  She tolerated the  procedure well without any apparent complications and was taken to the  recovery room in satisfactory condition.      Adolph Pollack, M.D.  Electronically Signed     TJR/MEDQ  D:  06/05/2008  T:  06/05/2008  Job:  [id number]   cc:   Hal T. Stoneking, M.D.  Fax: [phone]

## 2010-06-05 NOTE — Discharge Summary (Signed)
NAME:  Julia Day, Julia Day                         ACCOUNT NO.:  [id number]   MEDICAL RECORD NO.:  [id number]                   PATIENT TYPE:  INP   LOCATION:  0482                                 FACILITY:  [facility]   PHYSICIAN:  Malachi Pro. Ambrose Mantle, M.D.              DATE OF BIRTH:  08-18-1950   DATE OF ADMISSION:  06/26/2003  DATE OF DISCHARGE:  06/28/2003                                 DISCHARGE SUMMARY   This is an 60 year old white female admitted for vaginal hysterectomy and  A&P repair because of symptomatic pelvic prolapse.  Patient was admitted on  June 26, 2003 and underwent a vaginal hysterectomy, A&P repair by Dr. Ambrose Mantle  with Dr. Jackelyn Knife assisting under general anesthesia.  Blood loss about 250  cc.  One vaginal pack was left.  The pack was removed on the first  postoperative day with minimal staining.  The Foley catheter was removed on  the first postop day, and the patient did void well; however, she did have a  high residual urine.  She voided 500 cc one time and had a residual of 400,  another time voided 400 and had a residual of 350.  She was begun on  Urecholine.  She never became symptomatic, and it as assumed because of the  marked cystocele that her bladder had lost its tone.   She did develop a temperature to 101.8 on the evening of the first postop  day.  Urine was obtained for analysis, which was negative.  Urine culture  showed 1000 colonies per milliliters, which was an insignificant growth.  The patient was placed on clindamycin and gentamicin early in the morning on  the second postop day but beginning the evening of the first postop day, she  states that she felt well and never became febrile again through 8:00 on the  second postop day, and she was discharged.   Laboratory data showed an initial hemoglobin of 13.6, hematocrit 39.7, white  count 4000, platelet count 165,000.  Follow-up hematocrit is 34.1 and 32.9.  Comprehensive metabolic profile was normal.   Urine culture 1000 colonies per  milliliter.  Path report:  Uterus with benign cervical mucosa and nabothian  cyst.  No dysplasia.  Benign weakly proliferative endometrium.  No  hyperplasia or evidence of malignancy.  Adenomatosis.  Serosa with no  pathologic abnormality.  Portions of vaginal mucosa benign.  Squamous mucosa  with no dysplasia or evidence of malignancy.  The uterus weighed 48 grams.   EKG was normal.   FINAL DIAGNOSES:  1. Pelvic relaxation with uterine prolapse.  2. Cystocele and rectocele.  3. Postoperative fever, thought to be secondary to atelectasis.   OPERATION:  Vaginal hysterectomy, anterior and posterior repair.   FINAL CONDITION:  Improved.   INSTRUCTIONS:  Include our regular discharge instructions.  No vaginal  entrance.  No heavy lifting or strenuous activity.  Call with any  temperature elevation  greater than 100.4 degrees.  Call with any heavy  vaginal bleeding.  Urecholine 10 mg 3 times a day for 5 days.  Doxycycline  100 mg by mouth every 12 hours for 7 days.  Mepergan  Fortis 24 tablets 1-2 every 3-4 hours as needed for pain.  Once the patient  is through with her [facility], she can resume her Ultram.  She is also  to resume all medications that she took at home prior to admission.  She is  to return to see me in one week for follow-up examination.                                               Malachi Pro. Ambrose Mantle, M.D.    TFH/MEDQ  D:  06/28/2003  T:  06/28/2003  Job:  [id number]   cc:   Hal T. Stoneking, M.D.  [address] [place], [place] [address]  Fax: [phone]

## 2010-06-05 NOTE — H&P (Signed)
NAME:  GENNETTE, SHADIX                         ACCOUNT NO.:  [id number]   MEDICAL RECORD NO.:  [id number]                   PATIENT TYPE:  AMB   LOCATION:  DAY                                  FACILITY:  [facility]   PHYSICIAN:  Malachi Pro. Ambrose Mantle, M.D.              DATE OF BIRTH:  02/02/1950   DATE OF ADMISSION:  06/26/2003  DATE OF DISCHARGE:                                HISTORY & PHYSICAL   HISTORY OF PRESENT ILLNESS:  A 60 year old white married female, para 2-0-0-  2, who is admitted to the hospital for vaginal hysterectomy, A&P repair,  because of uterine prolapse and large cystocele and smaller rectocele.  This  patient has had a long history of prolapse, although she only became  significantly aware of it in November 2004.  She called our office stating  that she felt a lot of pressure and she could see her uterus bulging from  her vagina.  At that time an examination showed cervical prolapse, fourth  degree cystocele, third degree rectocele.  She also had a tender structure  in her cul-de-sac that by ultrasound showed a parovarian cyst, the same as  had been seen on prior ultrasound exams.  The patient was offered surgery or  observation and at some point she became desirous of having surgery and is  admitted now for that procedure.  The patient describes her symptoms as  feeling like something is falling out of her vagina.  She also states that  sex is not as comfortable and feels different.  She occasionally has trouble  urinating but has no stress urinary incontinence.   PAST MEDICAL HISTORY:  Allergy to SULFA.  She does not like to take codeine.  She is allergic to ERYTHROMYCIN.   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  Tonsillectomy and adenoidectomy.  Reconstruction of her left  hand.  Abdominal wall-plasty.  Three surgeries on her right heel.   Illnesses:  The patient has a history of what could have been lupus  erythematosus, but this has been present for 25-30 years though it has not  run the course of the usual lupus.  She does have macular degeneration.  She  has no major heart problems, no alcohol or tobacco.   REVIEW OF SYSTEMS:  Negative except as in the present illness and occasional  headaches.   MEDICATIONS:  The patient takes Synthroid, Ultram, BuSpar, aspirin, calcium,  soy, and Pepcid.   FAMILY HISTORY:  Mother died at 75 of melanoma.  Father died at 38 of heart  problems.  Two sisters, one has MS, and one brother has heart problems.   PHYSICAL EXAMINATION:  GENERAL:  A well-developed, well-nourished,  attractive white female in no acute distress.  VITAL SIGNS:  Blood pressure 134/82, pulse 80, weight 147-1/2 pounds.  HEENT:  Head, eyes, ears, nose, and throat show no cranial abnormalities.  Extraocular  movements are intact.  Nose and pharynx are clear.  NECK:  Supple without thyromegaly.  CARDIAC:  Normal size and sounds, no murmurs.  CHEST:  The lungs are clear to P&A.  BREASTS:  Soft without masses.  ABDOMEN:  Soft and nontender, no masses are palpable.  There are scars from  her abdominoplasty.  PELVIC:  The vulva and vagina are clean.  The cervix is clean.  The uterus  is thought to be midplane and normal size.  The adnexa are clear.  No masses  are palpable in the rectum.  There is what is considered to be a fourth  degree cystocele, second to third degree rectocele, and moderate prolapse of  the cervix.   ADMITTING IMPRESSION:  Pelvic prolapse with cystocele and rectocele.   The patient is admitted for vaginal hysterectomy, A&P repair.  If the  parovarian cyst is present and easily accessible, I will possibly remove  that.  The patient has been informed of the risks of surgery including but  not limited to heart attack, stroke, pulmonary embolus, wound disruption,  hemorrhage with need for reoperation and/or transfusion, fistula formation,  nerve injury, and  intestinal obstruction.  She understands and agrees to  proceed.  Even though she does have some descent of the urethrovesical  junction, the patient does not have stress urinary incontinence.  She has  been informed that the surgery could even contribute to producing stress  urinary incontinence and if that were the case, she might would have to have  a sling procedure in the future.  The patient understands that the surgery  will cause a shortening and narrowing of the vagina and might cause an  initial interference with sexual activity.                                               Malachi Pro. Ambrose Mantle, M.D.    TFH/MEDQ  D:  06/26/2003  T:  06/26/2003  Job:  [id number]

## 2010-06-05 NOTE — Op Note (Signed)
NAME:  Julia Day, Julia Day                         ACCOUNT NO.:  [id number]   MEDICAL RECORD NO.:  [id number]                   PATIENT TYPE:  OBV   LOCATION:  0098                                 FACILITY:  [facility]   PHYSICIAN:  Malachi Pro. Ambrose Mantle, M.D.              DATE OF BIRTH:  [birth year]   DATE OF PROCEDURE:  06/26/2003  DATE OF DISCHARGE:                                 OPERATIVE REPORT   POSTOPERATIVE DIAGNOSES:  1. Pelvic relaxation with uterine prolapse.  2. Cystocele and rectocele, symptomatic.   POSTOPERATIVE DIAGNOSES:  1. Pelvic relaxation with uterine prolapse.  2. Cystocele and rectocele, symptomatic.   OPERATION:  1. Vaginal hysterectomy.  2. Anterior and posterior repair.   SURGEON:  Malachi Pro. Ambrose Mantle, M.D.   ASSISTANT:  Zenaida Niece, M.D.   ANESTHESIA:  General.   Patient was brought to the operating room and placed under satisfactory  general anesthesia and placed in a lithotomy position.  Exam revealed a 3rd  to 4th degree cystocele, 2nd to 3rd degree rectocele, and some uterine  dissensus.  The vulva, vagina, perineum, and urethra were prepped with  Betadine solution.  The Foley catheter was inserted to straight drain.  The  area was draped in a sterile field.  The cervix, which was fish-mouthed and  protruded close to the introitus, was grasped with Lahey clamps in the  cervicovaginal junction, was injected with a dilute solution of Neo-  Synephrine.  A circumferential incision was then made around the cervix at  the cervicovaginal junction.  The anterior vaginal mucosa was pushed  anteriorly.  The posterior cul de sac was then identified and entered.  Both  uterosacral ligaments and cardinal ligaments were clamped, cut, and suture-  ligated, and the uterosacral ligaments were held.  Additional bites were  placed, were taken perimetrially and clamped, cut, and suture-ligated.  The  anterior peritoneum was then identified and entered.  The bladder was  retracted away.  The upper pedicles were clamped across and doubly suture-  ligated, and the uterus was removed.  Both ovaries were identified and were  atrophic and normal.  The tubes appeared normal.  There was no sign of the  para-ovarian cyst that had shown up on prior ultrasounds.  The posterior  vaginal cuff was sewn with running locked sutures of 0 Vicryl, obtaining  complete hemostasis.  No other bleeding was seen.   Reperitonealization was done with a running suture of #1 Vicryl.  I did not  tie it at this point.  I sutured the uterosacral ligaments together in the  midline, above where they had been clamped and cut and suture-ligated.  I  tied the uterosacral ligaments and had already been held together in the  midline.  The purse-string suture was then tied down, extra-peritonealizing  the vaginal mucosa and closing the peritoneal cavity.  I then under-mined  the anterior vaginal mucosa to about 2  cm from the urethral meatus,  developing a cystocele, obliterated the cystocele with multiple interrupted  sutures of 0 Vicryl, cut away at redundant vaginal mucosa, and reunited the  vagina in the midline, using interrupted figure-of-eight sutures of 0  Vicryl.  This was taken all the way to the vaginal cuff.  At this point, I  cut across the posterior fourchette transversely, undermined the posterior  vaginal mucosa all the way to the cuff, developed the rectocele, obliterated  it with interrupted sutures of 0 Vicryl, cut away redundant vaginal mucosa,  and reunited the vaginal mucosa in the midline using interrupted figure-of-  eight sutures of 0 Vicryl.  The perineum was rebuilt with a running suture  of 3-0 Vicryl.  Hemostasis was adequate.  A 2 inch iodoform pack was placed  into the vagina.  There was a little bit of constriction of the vagina at  the upper one-third, being very tight on two fingers.  This might require  dilatation in the future if it does not respond to just  normal vaginal  activity.  Blood loss was estimated at 250 mL.  Sponge and needle counts  were correct.  Patient was returned to recovery in satisfactory condition.                                               Malachi Pro. Ambrose Mantle, M.D.    TFH/MEDQ  D:  06/26/2003  T:  06/26/2003  Job:  [id number]   cc:   Hal T. Stoneking, M.D.  [address] [place], [place] [address]  Fax: [phone]

## 2011-01-20 IMAGING — RF DG CHOLANGIOGRAM OPERATIVE
1 series · 4 of 4 positions shown · non-contrast
Comparison: none

CLINICAL DATA: Cholelithiasis

INTRAOPERATIVE CHOLANGIOGRAM
TECHNIQUE: Multiple fluoroscopic spot radiographs were obtained
during intraoperative cholangiogram and are submitted for
interpretation post-operatively.

[Series 1: run · 4 of 50 frames shown]
[frame 8/50]
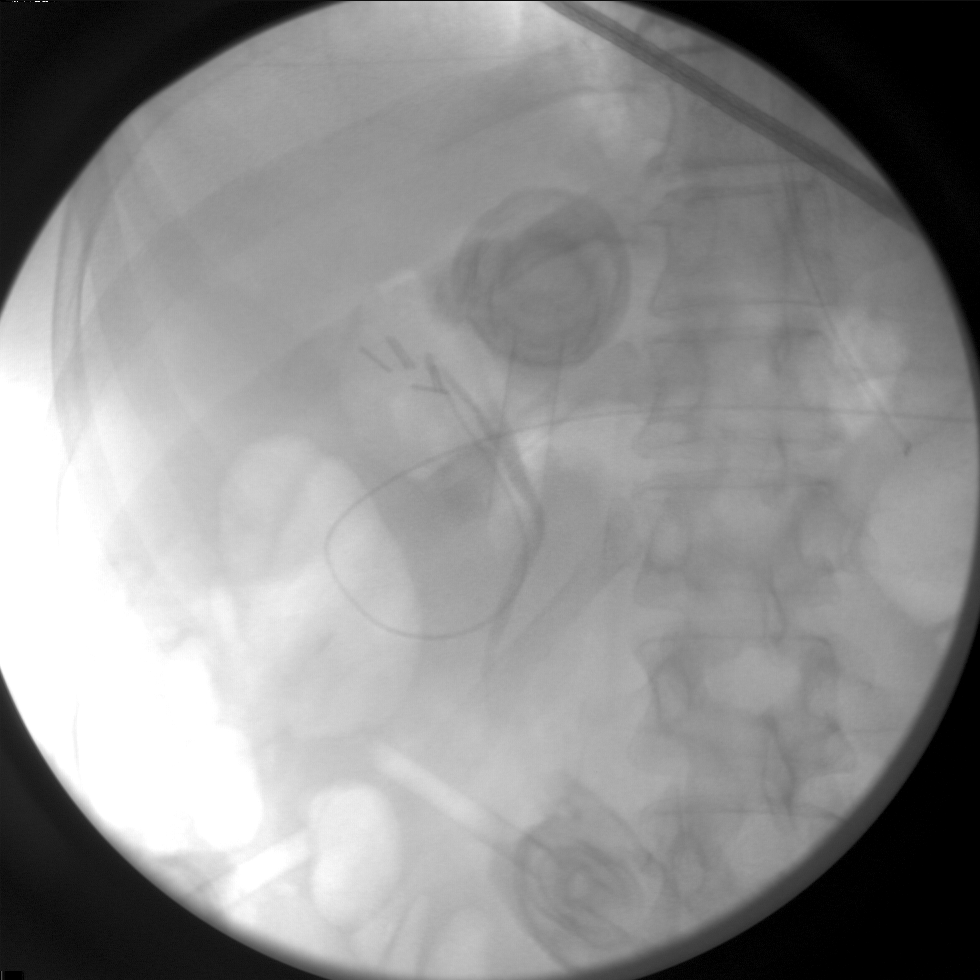
[frame 26/50]
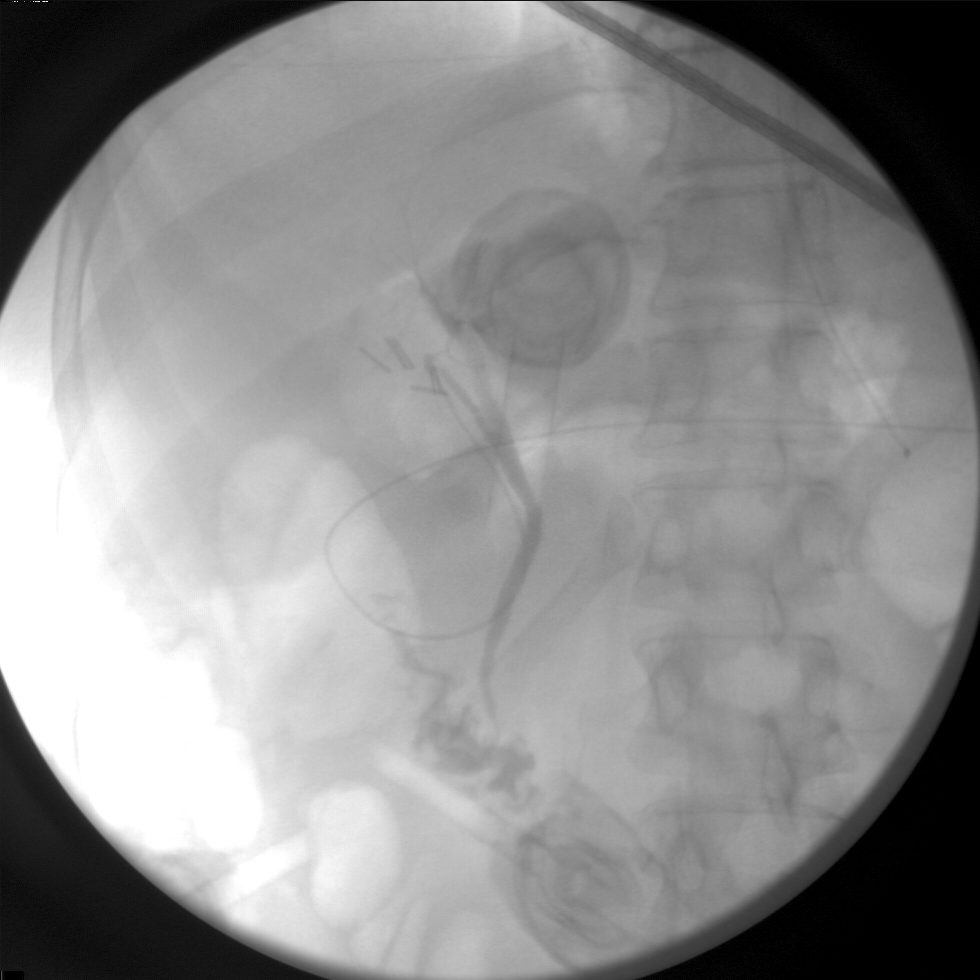
[frame 43/50]
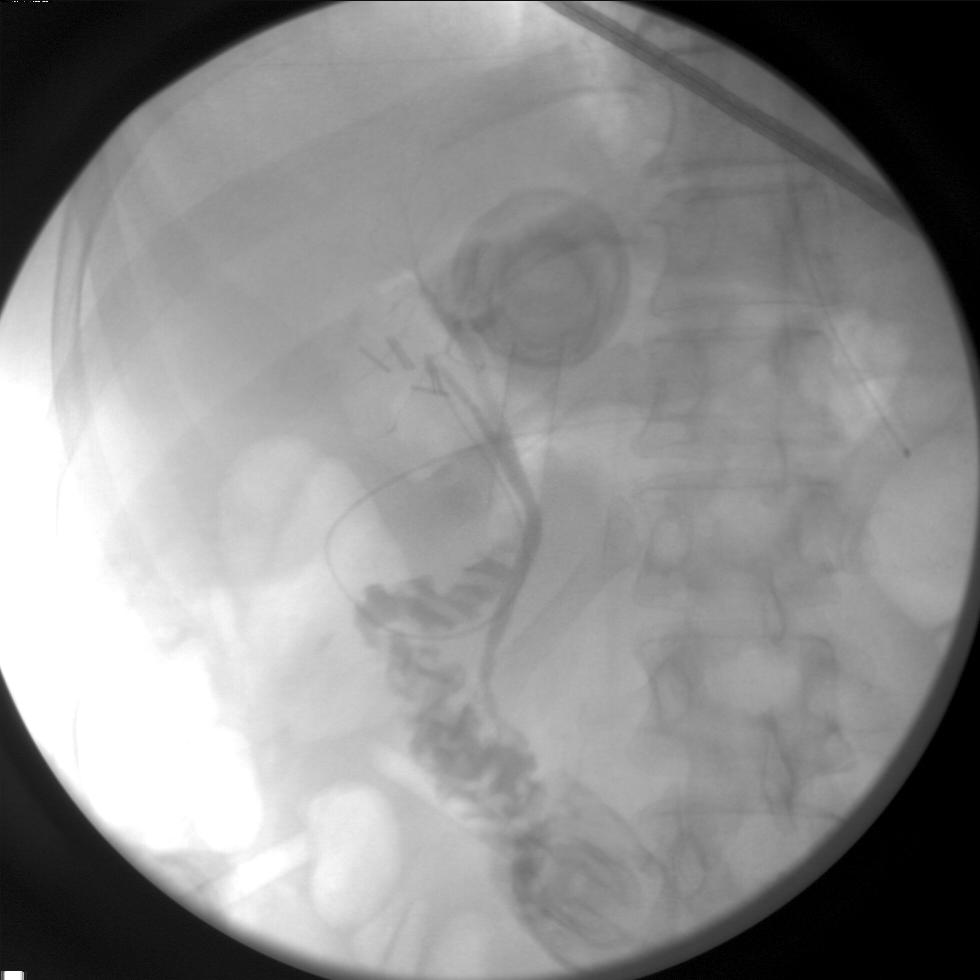
[frame 49/50]
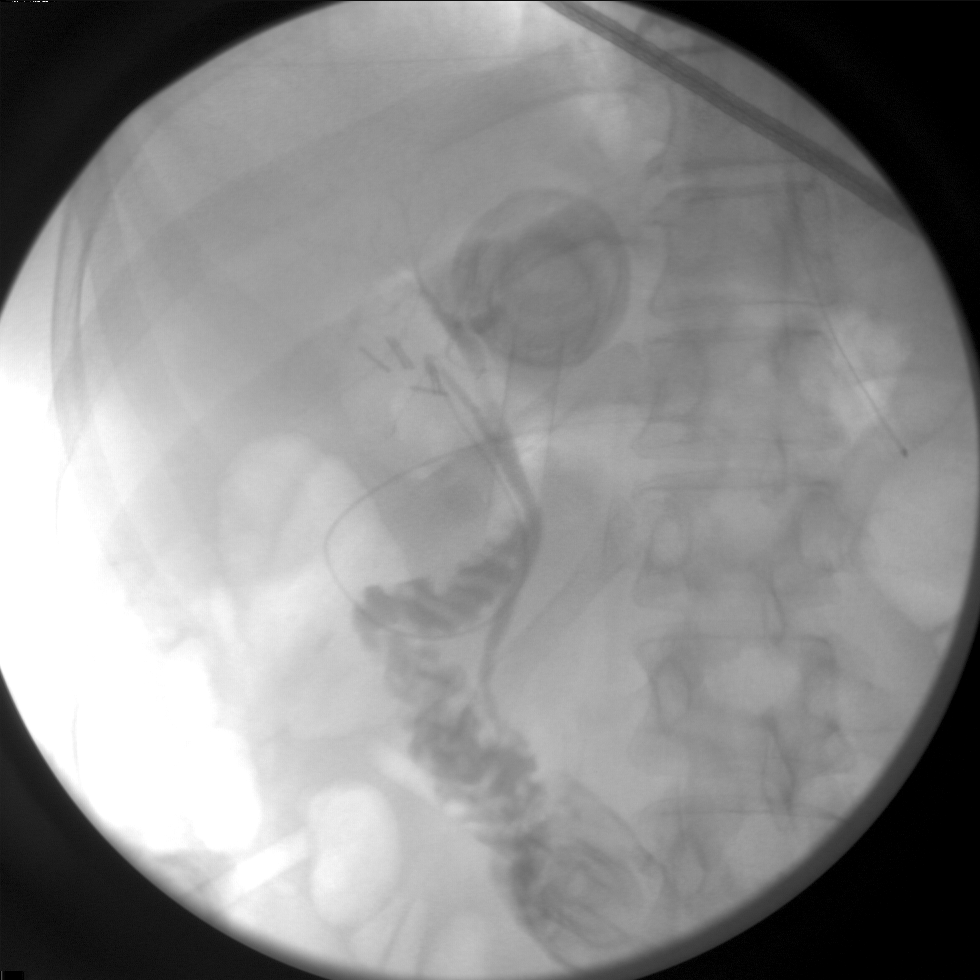

[4 of 4 positions shown; findings below may reference images not displayed]

FINDINGS: Contrast fills the biliary tree and duodenum.  No stones.
IMPRESSION: Patent without stones.

## 2011-01-21 ENCOUNTER — Other Ambulatory Visit: Payer: Self-pay | Admitting: Obstetrics and Gynecology

## 2011-01-21 DIAGNOSIS — Z1231 Encounter for screening mammogram for malignant neoplasm of breast: Secondary | ICD-10-CM

## 2011-02-17 ENCOUNTER — Ambulatory Visit
Admission: RE | Admit: 2011-02-17 | Discharge: 2011-02-17 | Disposition: A | Discharge status: Home / Self Care | Payer: BC Managed Care – PPO | Source: Ambulatory Visit | Attending: Obstetrics and Gynecology | Admitting: Obstetrics and Gynecology

## 2011-02-17 DIAGNOSIS — Z1231 Encounter for screening mammogram for malignant neoplasm of breast: Secondary | ICD-10-CM

## 2011-03-09 ENCOUNTER — Ambulatory Visit
Admission: RE | Admit: 2011-03-09 | Discharge: 2011-03-09 | Disposition: A | Discharge status: Home / Self Care | Payer: BC Managed Care – PPO | Source: Ambulatory Visit | Attending: Geriatric Medicine | Admitting: Geriatric Medicine

## 2011-03-09 ENCOUNTER — Other Ambulatory Visit: Payer: Self-pay | Admitting: Geriatric Medicine

## 2011-03-09 DIAGNOSIS — R079 Chest pain, unspecified: Secondary | ICD-10-CM

## 2011-03-09 MED ORDER — IOHEXOL 350 MG/ML SOLN
100.0000 mL | Freq: Once | INTRAVENOUS | Status: AC | PRN
  Administered 2011-03-09: 100 mL via INTRAVENOUS

## 2011-03-10 ENCOUNTER — Other Ambulatory Visit: Payer: Self-pay | Admitting: Geriatric Medicine

## 2011-03-10 DIAGNOSIS — K7460 Unspecified cirrhosis of liver: Secondary | ICD-10-CM

## 2011-03-11 ENCOUNTER — Ambulatory Visit
Admission: RE | Admit: 2011-03-11 | Discharge: 2011-03-11 | Disposition: A | Discharge status: Home / Self Care | Payer: BC Managed Care – PPO | Source: Ambulatory Visit | Attending: Geriatric Medicine | Admitting: Geriatric Medicine

## 2011-03-11 DIAGNOSIS — K7460 Unspecified cirrhosis of liver: Secondary | ICD-10-CM

## 2011-04-08 ENCOUNTER — Other Ambulatory Visit (HOSPITAL_COMMUNITY): Payer: Self-pay | Admitting: Geriatric Medicine

## 2011-04-08 DIAGNOSIS — K7460 Unspecified cirrhosis of liver: Secondary | ICD-10-CM

## 2011-04-27 ENCOUNTER — Other Ambulatory Visit: Payer: Self-pay | Admitting: Radiology

## 2011-04-30 ENCOUNTER — Other Ambulatory Visit: Payer: Self-pay | Admitting: Radiology

## 2011-05-05 ENCOUNTER — Other Ambulatory Visit: Payer: Self-pay | Admitting: Radiology

## 2011-05-06 ENCOUNTER — Other Ambulatory Visit: Payer: Self-pay | Admitting: Radiology

## 2011-05-06 ENCOUNTER — Encounter (HOSPITAL_COMMUNITY): Payer: Self-pay | Admitting: Pharmacy Technician

## 2011-05-07 ENCOUNTER — Encounter (HOSPITAL_COMMUNITY): Payer: Self-pay

## 2011-05-07 ENCOUNTER — Ambulatory Visit (HOSPITAL_COMMUNITY)
Admission: RE | Admit: 2011-05-07 | Discharge: 2011-05-07 | Disposition: A | Discharge status: Home / Self Care | Payer: BC Managed Care – PPO | Source: Ambulatory Visit | Attending: Geriatric Medicine | Admitting: Geriatric Medicine

## 2011-05-07 DIAGNOSIS — K7460 Unspecified cirrhosis of liver: Secondary | ICD-10-CM

## 2011-05-07 DIAGNOSIS — Z79899 Other long term (current) drug therapy: Secondary | ICD-10-CM | POA: Insufficient documentation

## 2011-05-07 DIAGNOSIS — E039 Hypothyroidism, unspecified: Secondary | ICD-10-CM | POA: Insufficient documentation

## 2011-05-07 DIAGNOSIS — M329 Systemic lupus erythematosus, unspecified: Secondary | ICD-10-CM | POA: Insufficient documentation

## 2011-05-07 HISTORY — DX: Reserved for concepts with insufficient information to code with codable children: IMO0002

## 2011-05-07 HISTORY — DX: Hypothyroidism, unspecified: E03.9

## 2011-05-07 HISTORY — DX: Systemic lupus erythematosus, unspecified: M32.9

## 2011-05-07 HISTORY — DX: Unspecified cirrhosis of liver: K74.60

## 2011-05-07 LAB — CBC
HCT: 39.6 % (ref 36.0–46.0)
MCH: 29.7 pg (ref 26.0–34.0)
MCV: 91 fL (ref 78.0–100.0)
RBC: 4.35 MIL/uL (ref 3.87–5.11)
WBC: 5.6 10*3/uL (ref 4.0–10.5)

## 2011-05-07 MED ORDER — MIDAZOLAM HCL 5 MG/5ML IJ SOLN
INTRAMUSCULAR | Status: AC | PRN
  Administered 2011-05-07 (×3): 1 mg via INTRAVENOUS

## 2011-05-07 MED ORDER — MIDAZOLAM HCL 2 MG/2ML IJ SOLN
INTRAMUSCULAR | Status: AC
  Filled 2011-05-07: qty 6

## 2011-05-07 MED ORDER — FENTANYL CITRATE 0.05 MG/ML IJ SOLN
INTRAMUSCULAR | Status: AC | PRN
  Administered 2011-05-07 (×3): 50 ug via INTRAVENOUS

## 2011-05-07 MED ORDER — FENTANYL CITRATE 0.05 MG/ML IJ SOLN
INTRAMUSCULAR | Status: AC
  Filled 2011-05-07: qty 6

## 2011-05-07 MED ORDER — SODIUM CHLORIDE 0.9 % IV SOLN
Freq: Once | INTRAVENOUS | Status: AC
  Administered 2011-05-07: 500 mL via INTRAVENOUS
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
## 2011-05-07 NOTE — Discharge Instructions (Signed)
Liver Biopsy A liver biopsy is done to confirm or prove a suspected problem. The liver is a large organ in the upper right hand of your abdomen. To do the test, the doctor puts a small needle into the right side of your abdomen. A tiny piece of liver tissue is taken and sent for testing. This should not be painful as the skin is injected with a local anesthetic that numbs the area.  HOW A BIOPSY IS PERFORMED This is often performed as a same day surgery. This can be done in a hospital or clinic. Biopsies are often done under local anesthesia which makes the area of biopsy numb. Sometimes sedation is given to help patients relax. If you are taking blood thinning medications or medications containing aspirin, this must be discussed with your caregiver before the test. This medication may need to be stopped for up to 7 days before the procedure, or the dose may need to be changed. You should review all of your other medications with your caregiver before the test. You must remain in bed for 1 to 2 hours after the test. Having something to read may help pass the time.  LET YOUR CAREGIVERS KNOW ABOUT THE FOLLOWING:  Allergies.   Medications taken including herbs, eye drops, over -the- counter medications, and creams.   Use of steroids (by mouth or creams).   Previous problems with anesthetics or novocaine.   Possibility of pregnancy, if this applies.   History of blood clots (thrombophlebitis).   History of bleeding or blood problems.   Previous surgery.   Other health problems.  BEFORE THE PROCEDURE You should be present 60 minutes prior to your procedure or as directed. Check in at the admissions desk for filling out necessary forms if not pre-registered. There will be consent forms to sign prior to the procedure. There is a waiting area for your family while you are having your biopsy. AFTER THE PROCEDURE  After your biopsy, you will be taken to the recovery area where a nurse will watch  and check your progress.   You may have to lie on your right side for 1 to 2 hours.   Your blood pressure and pulse will be checked often.   If you are having pain or feel sick, tell your nurse.   After 1 to 2 hours, if you are going home, you may sit in a chair and get dressed. The nurse will let you know when you can get up.   Once you are doing well, barring other problems, you will be allowed to go home. Once at home, putting an ice pack on your operative site may help with discomfort and keep swelling down.   You may resume a normal diet and activities as directed.   Change dressings as directed.   Only take over-the-counter or prescription medicines for pain, discomfort, or fever as directed by your caregiver.   Call for your results as instructed by your caregiver. Remember it is your job to be sure you get the results of your biopsy and any additional tests performed on the sample taken. Do not assume everything is fine if you do not hear from your caregiver.  HOME CARE INSTRUCTIONS   You should rest for one to two days or as instructed.   You will need to have a responsible adult take you home and stay with you overnight.   Do not lift over 5 lbs. or play contact sports for two weeks.     Do not drive for 24 hours.   Do not take medication containing aspirin or drink alcohol for one week after this test.  SEEK MEDICAL CARE IF:   There is increased bleeding (more than a small spot) from the biopsy site.   You have redness, swelling, or increasing pain in the biopsy site.   You develop swelling or pain in the abdomen.   You have an unexplained oral temperature over 102 F (38.9 C).   You notice a foul smell coming from the wound or dressing.  SEEK IMMEDIATE MEDICAL CARE IF:   You develop a rash.   You have difficulty breathing.   You have allergic problems such as itching or swelling or shortness of breath.  Document Released: 03/27/2003 Document Revised:  12/24/2010 Document Reviewed: 08/15/2007 Exit[employer] Patient Information 2012 [employer]. 

## 2011-05-07 NOTE — H&P (Signed)
Agree 

## 2011-05-07 NOTE — Procedures (Signed)
Procedure:  Ultrasound guided liver biopsy Findings:  3 separate 18 G core bx via 17 G needle, right lobe of liver.

## 2012-10-20 ENCOUNTER — Other Ambulatory Visit: Payer: Self-pay

## 2012-10-20 DIAGNOSIS — Z1231 Encounter for screening mammogram for malignant neoplasm of breast: Secondary | ICD-10-CM

## 2012-11-10 ENCOUNTER — Ambulatory Visit
Admission: RE | Admit: 2012-11-10 | Discharge: 2012-11-10 | Disposition: A | Discharge status: Home / Self Care | Payer: BC Managed Care – PPO | Source: Ambulatory Visit

## 2012-11-10 DIAGNOSIS — Z1231 Encounter for screening mammogram for malignant neoplasm of breast: Secondary | ICD-10-CM

## 2013-03-30 ENCOUNTER — Ambulatory Visit: Admit: 2013-03-30 | Payer: Self-pay | Admitting: General Surgery

## 2013-03-30 ENCOUNTER — Emergency Department (HOSPITAL_COMMUNITY): Payer: BC Managed Care – PPO

## 2013-03-30 ENCOUNTER — Encounter (HOSPITAL_COMMUNITY): Payer: BC Managed Care – PPO | Admitting: Anesthesiology

## 2013-03-30 ENCOUNTER — Encounter (HOSPITAL_COMMUNITY)
Admission: EM | Disposition: A | Discharge status: Home / Self Care | Payer: Self-pay | Source: Home / Self Care | Attending: Emergency Medicine

## 2013-03-30 ENCOUNTER — Encounter (HOSPITAL_COMMUNITY): Payer: Self-pay | Admitting: Emergency Medicine

## 2013-03-30 ENCOUNTER — Telehealth (INDEPENDENT_AMBULATORY_CARE_PROVIDER_SITE_OTHER): Payer: Self-pay

## 2013-03-30 ENCOUNTER — Observation Stay (HOSPITAL_COMMUNITY)
Admission: EM | Admit: 2013-03-30 | Discharge: 2013-03-31 | Disposition: A | Discharge status: Home / Self Care | Payer: BC Managed Care – PPO | Attending: General Surgery | Admitting: General Surgery

## 2013-03-30 ENCOUNTER — Emergency Department (HOSPITAL_COMMUNITY): Payer: BC Managed Care – PPO | Admitting: Anesthesiology

## 2013-03-30 DIAGNOSIS — R1031 Right lower quadrant pain: Secondary | ICD-10-CM

## 2013-03-30 DIAGNOSIS — K745 Biliary cirrhosis, unspecified: Secondary | ICD-10-CM | POA: Insufficient documentation

## 2013-03-30 DIAGNOSIS — K37 Unspecified appendicitis: Secondary | ICD-10-CM | POA: Diagnosis present

## 2013-03-30 DIAGNOSIS — E039 Hypothyroidism, unspecified: Secondary | ICD-10-CM | POA: Insufficient documentation

## 2013-03-30 DIAGNOSIS — N83209 Unspecified ovarian cyst, unspecified side: Secondary | ICD-10-CM | POA: Insufficient documentation

## 2013-03-30 DIAGNOSIS — K3580 Unspecified acute appendicitis: Principal | ICD-10-CM | POA: Insufficient documentation

## 2013-03-30 DIAGNOSIS — I8500 Esophageal varices without bleeding: Secondary | ICD-10-CM | POA: Insufficient documentation

## 2013-03-30 DIAGNOSIS — I868 Varicose veins of other specified sites: Secondary | ICD-10-CM | POA: Insufficient documentation

## 2013-03-30 DIAGNOSIS — Z9089 Acquired absence of other organs: Secondary | ICD-10-CM | POA: Insufficient documentation

## 2013-03-30 DIAGNOSIS — K7460 Unspecified cirrhosis of liver: Secondary | ICD-10-CM | POA: Insufficient documentation

## 2013-03-30 DIAGNOSIS — M329 Systemic lupus erythematosus, unspecified: Secondary | ICD-10-CM | POA: Insufficient documentation

## 2013-03-30 HISTORY — DX: Esophageal varices without bleeding: I85.00

## 2013-03-30 HISTORY — PX: LAPAROSCOPIC APPENDECTOMY: SHX408

## 2013-03-30 LAB — URINALYSIS, ROUTINE W REFLEX MICROSCOPIC
Bilirubin Urine: NEGATIVE
Glucose, UA: NEGATIVE mg/dL
Hgb urine dipstick: NEGATIVE
Ketones, ur: >80 mg/dL — AB
Nitrite: NEGATIVE
Protein, ur: NEGATIVE mg/dL
Specific Gravity, Urine: 1.015 (ref 1.005–1.030)
Urobilinogen, UA: 0.2 mg/dL (ref 0.0–1.0)
pH: 6.5 (ref 5.0–8.0)

## 2013-03-30 LAB — CBC WITH DIFFERENTIAL/PLATELET
Basophils Absolute: 0 10*3/uL (ref 0.0–0.1)
Basophils Relative: 0 % (ref 0–1)
Eosinophils Absolute: 0 10*3/uL (ref 0.0–0.7)
Eosinophils Relative: 1 % (ref 0–5)
HCT: 40.6 % (ref 36.0–46.0)
Hemoglobin: 13.7 g/dL (ref 12.0–15.0)
Lymphocytes Relative: 19 % (ref 12–46)
Lymphs Abs: 1.4 10*3/uL (ref 0.7–4.0)
MCH: 29.4 pg (ref 26.0–34.0)
MCHC: 33.7 g/dL (ref 30.0–36.0)
MCV: 87.1 fL (ref 78.0–100.0)
Monocytes Absolute: 0.6 10*3/uL (ref 0.1–1.0)
Monocytes Relative: 8 % (ref 3–12)
Neutro Abs: 5.3 10*3/uL (ref 1.7–7.7)
Neutrophils Relative %: 72 % (ref 43–77)
Platelets: 172 10*3/uL (ref 150–400)
RBC: 4.66 MIL/uL (ref 3.87–5.11)
RDW: 12.7 % (ref 11.5–15.5)
WBC: 7.4 10*3/uL (ref 4.0–10.5)

## 2013-03-30 LAB — COMPREHENSIVE METABOLIC PANEL
ALT: 28 U/L (ref 0–35)
AST: 30 U/L (ref 0–37)
Albumin: 4 g/dL (ref 3.5–5.2)
Alkaline Phosphatase: 86 U/L (ref 39–117)
BUN: 8 mg/dL (ref 6–23)
CO2: 23 mEq/L (ref 19–32)
Calcium: 9.4 mg/dL (ref 8.4–10.5)
Chloride: 93 mEq/L — ABNORMAL LOW (ref 96–112)
Creatinine, Ser: 0.54 mg/dL (ref 0.50–1.10)
GFR calc Af Amer: >90 mL/min (ref >90)
GFR calc non Af Amer: >90 mL/min (ref >90)
Glucose, Bld: 94 mg/dL (ref 70–99)
Potassium: 3.7 mEq/L (ref 3.7–5.3)
Sodium: 131 mEq/L — ABNORMAL LOW (ref 137–147)
Total Bilirubin: 1.1 mg/dL (ref 0.3–1.2)
Total Protein: 8 g/dL (ref 6.0–8.3)

## 2013-03-30 LAB — LIPASE, BLOOD: Lipase: 34 U/L (ref 11–59)

## 2013-03-30 LAB — URINE MICROSCOPIC-ADD ON

## 2013-03-30 SURGERY — APPENDECTOMY, LAPAROSCOPIC
Anesthesia: General | Site: Abdomen

## 2013-03-30 MED ORDER — THYROID 30 MG PO TABS
45.0000 mg | ORAL_TABLET | Freq: Two times a day (BID) | ORAL | Status: DC
  Filled 2013-03-30 (×3): qty 2

## 2013-03-30 MED ORDER — NEOSTIGMINE METHYLSULFATE 1 MG/ML IJ SOLN
INTRAMUSCULAR | Status: DC | PRN
  Administered 2013-03-30: 4 mg via INTRAVENOUS

## 2013-03-30 MED ORDER — GLYCOPYRROLATE 0.2 MG/ML IJ SOLN
INTRAMUSCULAR | Status: AC
  Filled 2013-03-30: qty 3

## 2013-03-30 MED ORDER — LACTATED RINGERS IV SOLN
INTRAVENOUS | Status: DC | PRN
  Administered 2013-03-30: 19:00:00 via INTRAVENOUS

## 2013-03-30 MED ORDER — OXYCODONE HCL 5 MG PO TABS: 5.0000 mg | ORAL_TABLET | ORAL | Status: DC | PRN

## 2013-03-30 MED ORDER — PROMETHAZINE HCL 25 MG/ML IJ SOLN: 6.2500 mg | INTRAMUSCULAR | Status: DC | PRN

## 2013-03-30 MED ORDER — BUPIVACAINE-EPINEPHRINE 0.25% -1:200000 IJ SOLN
INTRAMUSCULAR | Status: AC
  Filled 2013-03-30: qty 1

## 2013-03-30 MED ORDER — MIDAZOLAM HCL 5 MG/5ML IJ SOLN
INTRAMUSCULAR | Status: DC | PRN
  Administered 2013-03-30: 2 mg via INTRAVENOUS

## 2013-03-30 MED ORDER — ONDANSETRON HCL 4 MG PO TABS: 4.0000 mg | ORAL_TABLET | Freq: Four times a day (QID) | ORAL | Status: DC | PRN

## 2013-03-30 MED ORDER — FENTANYL CITRATE 0.05 MG/ML IJ SOLN
INTRAMUSCULAR | Status: AC
  Filled 2013-03-30: qty 5

## 2013-03-30 MED ORDER — ERTAPENEM SODIUM 1 G IJ SOLR
1.0000 g | INTRAMUSCULAR | Status: AC
  Administered 2013-03-30: 1 g via INTRAVENOUS
  Filled 2013-03-30: qty 1

## 2013-03-30 MED ORDER — GLYCOPYRROLATE 0.2 MG/ML IJ SOLN
INTRAMUSCULAR | Status: DC | PRN
  Administered 2013-03-30: .7 mg via INTRAVENOUS

## 2013-03-30 MED ORDER — ROCURONIUM BROMIDE 100 MG/10ML IV SOLN
INTRAVENOUS | Status: DC | PRN
  Administered 2013-03-30: 30 mg via INTRAVENOUS

## 2013-03-30 MED ORDER — ONDANSETRON HCL 4 MG/2ML IJ SOLN
INTRAMUSCULAR | Status: DC | PRN
  Administered 2013-03-30: 4 mg via INTRAVENOUS

## 2013-03-30 MED ORDER — LACTATED RINGERS IV SOLN: INTRAVENOUS | Status: DC

## 2013-03-30 MED ORDER — OXYCODONE HCL 5 MG/5ML PO SOLN
5.0000 mg | Freq: Once | ORAL | Status: DC | PRN
  Filled 2013-03-30: qty 5

## 2013-03-30 MED ORDER — ONDANSETRON HCL 4 MG/2ML IJ SOLN
4.0000 mg | Freq: Once | INTRAMUSCULAR | Status: AC
  Administered 2013-03-30: 4 mg via INTRAVENOUS
  Filled 2013-03-30: qty 2

## 2013-03-30 MED ORDER — MEPERIDINE HCL 50 MG/ML IJ SOLN: 6.2500 mg | INTRAMUSCULAR | Status: DC | PRN

## 2013-03-30 MED ORDER — LIP MEDEX EX OINT
TOPICAL_OINTMENT | CUTANEOUS | Status: AC
  Administered 2013-03-30: 22:00:00
  Filled 2013-03-30: qty 7

## 2013-03-30 MED ORDER — HYDROMORPHONE HCL PF 1 MG/ML IJ SOLN
INTRAMUSCULAR | Status: DC | PRN
  Administered 2013-03-30: 0.5 mg via INTRAVENOUS
  Administered 2013-03-30: 1 mg via INTRAVENOUS
  Administered 2013-03-30: 0.5 mg via INTRAVENOUS

## 2013-03-30 MED ORDER — MORPHINE SULFATE 4 MG/ML IJ SOLN
4.0000 mg | Freq: Once | INTRAMUSCULAR | Status: DC
  Filled 2013-03-30 (×2): qty 1

## 2013-03-30 MED ORDER — ERTAPENEM SODIUM 1 G IJ SOLR
INTRAMUSCULAR | Status: AC
  Filled 2013-03-30: qty 1

## 2013-03-30 MED ORDER — PHENYLEPHRINE HCL 10 MG/ML IJ SOLN
INTRAMUSCULAR | Status: DC | PRN
  Administered 2013-03-30 (×2): 40 ug via INTRAVENOUS

## 2013-03-30 MED ORDER — SODIUM CHLORIDE 0.9 % IV SOLN: INTRAVENOUS | Status: DC

## 2013-03-30 MED ORDER — MIDAZOLAM HCL 2 MG/2ML IJ SOLN
INTRAMUSCULAR | Status: AC
  Filled 2013-03-30: qty 2

## 2013-03-30 MED ORDER — HYDROMORPHONE HCL PF 1 MG/ML IJ SOLN
0.2500 mg | INTRAMUSCULAR | Status: DC | PRN
  Administered 2013-03-30 (×2): 0.5 mg via INTRAVENOUS

## 2013-03-30 MED ORDER — PROPOFOL 10 MG/ML IV BOLUS
INTRAVENOUS | Status: DC | PRN
  Administered 2013-03-30: 120 mg via INTRAVENOUS

## 2013-03-30 MED ORDER — ROCURONIUM BROMIDE 100 MG/10ML IV SOLN
INTRAVENOUS | Status: AC
  Filled 2013-03-30: qty 1

## 2013-03-30 MED ORDER — PROPOFOL 10 MG/ML IV BOLUS
INTRAVENOUS | Status: AC
  Filled 2013-03-30: qty 20

## 2013-03-30 MED ORDER — BUPIVACAINE-EPINEPHRINE 0.5% -1:200000 IJ SOLN
INTRAMUSCULAR | Status: DC | PRN
  Administered 2013-03-30: 20 mL

## 2013-03-30 MED ORDER — ONDANSETRON HCL 4 MG/2ML IJ SOLN
INTRAMUSCULAR | Status: AC
  Filled 2013-03-30: qty 2

## 2013-03-30 MED ORDER — DEXAMETHASONE SODIUM PHOSPHATE 10 MG/ML IJ SOLN
INTRAMUSCULAR | Status: DC | PRN
  Administered 2013-03-30: 10 mg via INTRAVENOUS

## 2013-03-30 MED ORDER — ONDANSETRON HCL 4 MG/2ML IJ SOLN
4.0000 mg | Freq: Four times a day (QID) | INTRAMUSCULAR | Status: DC | PRN
  Administered 2013-03-30: 4 mg via INTRAVENOUS
  Filled 2013-03-30: qty 2

## 2013-03-30 MED ORDER — IOHEXOL 300 MG/ML  SOLN
100.0000 mL | Freq: Once | INTRAMUSCULAR | Status: AC | PRN
  Administered 2013-03-30: 100 mL via INTRAVENOUS

## 2013-03-30 MED ORDER — BUPIVACAINE-EPINEPHRINE 0.5% -1:200000 IJ SOLN
INTRAMUSCULAR | Status: AC
  Filled 2013-03-30: qty 1

## 2013-03-30 MED ORDER — HYDROMORPHONE HCL PF 2 MG/ML IJ SOLN
INTRAMUSCULAR | Status: AC
  Filled 2013-03-30: qty 1

## 2013-03-30 MED ORDER — OXYCODONE HCL 5 MG PO TABS: 5.0000 mg | ORAL_TABLET | Freq: Once | ORAL | Status: DC | PRN

## 2013-03-30 MED ORDER — FENTANYL CITRATE 0.05 MG/ML IJ SOLN
INTRAMUSCULAR | Status: DC | PRN
  Administered 2013-03-30: 100 ug via INTRAVENOUS
  Administered 2013-03-30 (×3): 50 ug via INTRAVENOUS

## 2013-03-30 MED ORDER — DEXAMETHASONE SODIUM PHOSPHATE 10 MG/ML IJ SOLN
INTRAMUSCULAR | Status: AC
  Filled 2013-03-30: qty 1

## 2013-03-30 MED ORDER — SUCCINYLCHOLINE CHLORIDE 20 MG/ML IJ SOLN
INTRAMUSCULAR | Status: DC | PRN
  Administered 2013-03-30: 80 mg via INTRAVENOUS

## 2013-03-30 MED ORDER — IOHEXOL 300 MG/ML  SOLN
50.0000 mL | Freq: Once | INTRAMUSCULAR | Status: AC | PRN
  Administered 2013-03-30: 50 mL via ORAL

## 2013-03-30 MED ORDER — SODIUM CHLORIDE 0.9 % IV BOLUS (SEPSIS)
1000.0000 mL | Freq: Once | INTRAVENOUS | Status: AC
  Administered 2013-03-30: 1000 mL via INTRAVENOUS

## 2013-03-30 MED ORDER — MORPHINE SULFATE 2 MG/ML IJ SOLN
2.0000 mg | INTRAMUSCULAR | Status: DC | PRN
  Administered 2013-03-30: 2 mg via INTRAVENOUS

## 2013-03-30 MED ORDER — HYDROMORPHONE HCL PF 1 MG/ML IJ SOLN
INTRAMUSCULAR | Status: AC
  Filled 2013-03-30: qty 1

## 2013-03-30 SURGICAL SUPPLY — 37 items
APPLIER CLIP 5 13 M/L LIGAMAX5 (MISCELLANEOUS)
APPLIER CLIP ROT 10 11.4 M/L (STAPLE) ×3
CABLE HI FREQUENCY MONOPOLAR (ELECTROSURGICAL) IMPLANT
CANISTER SUCTION 2500CC (MISCELLANEOUS) ×3 IMPLANT
CHLORAPREP W/TINT 26ML (MISCELLANEOUS) ×3 IMPLANT
CLIP APPLIE 5 13 M/L LIGAMAX5 (MISCELLANEOUS) IMPLANT
CLIP APPLIE ROT 10 11.4 M/L (STAPLE) ×1 IMPLANT
CUTTER FLEX LINEAR 45M (STAPLE) ×3 IMPLANT
DECANTER SPIKE VIAL GLASS SM (MISCELLANEOUS) ×3 IMPLANT
DERMABOND ADVANCED (GAUZE/BANDAGES/DRESSINGS) ×2
DERMABOND ADVANCED .7 DNX12 (GAUZE/BANDAGES/DRESSINGS) ×1 IMPLANT
DRAPE LAPAROSCOPIC ABDOMINAL (DRAPES) ×3 IMPLANT
DRAPE UTILITY XL STRL (DRAPES) ×3 IMPLANT
ELECT REM PT RETURN 9FT ADLT (ELECTROSURGICAL) ×3
ELECTRODE REM PT RTRN 9FT ADLT (ELECTROSURGICAL) ×1 IMPLANT
GLOVE BIO SURGEON STRL SZ 6.5 (GLOVE) ×2 IMPLANT
GLOVE BIO SURGEONS STRL SZ 6.5 (GLOVE) ×1
GLOVE BIOGEL PI IND STRL 7.0 (GLOVE) ×1 IMPLANT
GLOVE BIOGEL PI INDICATOR 7.0 (GLOVE) ×2
GOWN STRL REIN 2XL XLG LVL4 (GOWN DISPOSABLE) ×3 IMPLANT
GOWN STRL REUS W/TWL XL LVL3 (GOWN DISPOSABLE) ×6 IMPLANT
KIT BASIN OR (CUSTOM PROCEDURE TRAY) ×3 IMPLANT
POUCH SPECIMEN RETRIEVAL 10MM (ENDOMECHANICALS) ×3 IMPLANT
RELOAD 45 VASCULAR/THIN (ENDOMECHANICALS) ×3 IMPLANT
RELOAD STAPLE TA45 3.5 REG BLU (ENDOMECHANICALS) ×3 IMPLANT
SCALPEL HARMONIC ACE (MISCELLANEOUS) IMPLANT
SET IRRIG TUBING LAPAROSCOPIC (IRRIGATION / IRRIGATOR) ×3 IMPLANT
SOLUTION ANTI FOG 6CC (MISCELLANEOUS) ×3 IMPLANT
SUT VIC AB 4-0 PS2 27 (SUTURE) ×3 IMPLANT
TOWEL OR 17X26 10 PK STRL BLUE (TOWEL DISPOSABLE) ×3 IMPLANT
TOWEL OR NON WOVEN STRL DISP B (DISPOSABLE) ×3 IMPLANT
TRAY FOLEY CATH 14FRSI W/METER (CATHETERS) ×3 IMPLANT
TRAY LAP CHOLE (CUSTOM PROCEDURE TRAY) ×3 IMPLANT
TROCAR BLADELESS OPT 5 75 (ENDOMECHANICALS) ×6 IMPLANT
TROCAR XCEL 12X100 BLDLESS (ENDOMECHANICALS) IMPLANT
TROCAR XCEL BLUNT TIP 100MML (ENDOMECHANICALS) ×3 IMPLANT
TUBING INSUFFLATION 10FT LAP (TUBING) ×3 IMPLANT

## 2013-03-30 NOTE — Progress Notes (Signed)
   CARE MANAGEMENT ED NOTE 03/30/2013  Patient:  SHALETTA, HINOSTROZA   Account Number:  [id number]  Date Initiated:  03/30/2013  Documentation initiated by:  Jackelyn Poling  Subjective/Objective Assessment:   63 yr old female [facility] c/o abdominal pain pmh lupus, cirrhosis, hypothyroidism     Subjective/Objective Assessment Detail:   Pt listed without pcp but states pcp is Dr Mylinda Latina and she has seen Dr Lajean Manes  Pt requests pcp Tye Savoy office be contacted for update     Action/Plan:   CM spoke with pt updated Pcp in [facility] with Dr Tye Savoy at [phone] to update him pt was at [facility] ED He provided fax # [phone] No access to [facility] in MD office   Action/Plan Detail:   clinicals faxed to [phone] with fax confirmation at [phone] 03/30/13   Anticipated DC Date:       Status Recommendation to Physician:   Result of Recommendation:    Other ED [place]  Other  Outpatient Services - Pt will follow up  PCP issues    Choice offered to / List presented to:            Status of service:  Completed, signed off  ED Comments:   ED Comments Detail:

## 2013-03-30 NOTE — ED Notes (Signed)
Patient transported to CT 

## 2013-03-30 NOTE — Telephone Encounter (Signed)
Pt s/p cholecystectomy in 2010 by Dr. Zella Richer.  Dr. Ulanda Edison is seeing her today for RLQ pain, intermittent fevers and poor appetite.  He wanted to know if she should come here or go to [facility] ED.  I recommended the pt go to ED since this is most likely not related to surgery 5 years ago.  Dr. Ulanda Edison agreed to send the pt there today.
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
## 2013-03-30 NOTE — Anesthesia Preprocedure Evaluation (Signed)
Anesthesia Evaluation  Patient identified by MRN, date of birth, ID band Patient awake    Reviewed: Allergy & Precautions, H&P , NPO status , Patient's Chart, lab work & pertinent test results  Airway Mallampati: II TM Distance: >3 FB Neck ROM: Full    Dental  (+) Dental Advisory Given   Pulmonary neg pulmonary ROS,  breath sounds clear to auscultation        Cardiovascular negative cardio ROS  Rhythm:Regular Rate:Normal     Neuro/Psych negative neurological ROS  negative psych ROS   GI/Hepatic Neg liver ROS, GERD-  Medicated,  Endo/Other  Hypothyroidism   Renal/GU negative Renal ROS     Musculoskeletal negative musculoskeletal ROS (+)   Abdominal   Peds  Hematology negative hematology ROS (+)   Anesthesia Other Findings   Reproductive/Obstetrics negative OB ROS                           Anesthesia Physical Anesthesia Plan  ASA: II and emergent  Anesthesia Plan: General   Post-op Pain Management:    Induction: Intravenous, Rapid sequence and Cricoid pressure planned  Airway Management Planned: Oral ETT  Additional Equipment:   Intra-op Plan:   Post-operative Plan: Extubation in OR  Informed Consent: I have reviewed the patients History and Physical, chart, labs and discussed the procedure including the risks, benefits and alternatives for the proposed anesthesia with the patient or authorized representative who has indicated his/her understanding and acceptance.   Dental advisory given  Plan Discussed with: CRNA  Anesthesia Plan Comments:         Anesthesia Quick Evaluation

## 2013-03-30 NOTE — Transfer of Care (Signed)
Immediate Anesthesia Transfer of Care Note  Patient: Julia Day  Procedure(s) Performed: Procedure(s) (LRB): APPENDECTOMY LAPAROSCOPIC (N/A)  Patient Location: PACU  Anesthesia Type: General  Level of Consciousness: sedated, patient cooperative and responds to stimulation  Airway & Oxygen Therapy: Patient Spontanous Breathing and Patient connected to face mask oxgen  Post-op Assessment: Report given to PACU RN and Post -op Vital signs reviewed and stable  Post vital signs: Reviewed and stable  Complications: No apparent anesthesia complications

## 2013-03-30 NOTE — ED Notes (Signed)
Pt presents from PCP office after being referred to ED. Pt reports right lower abdominal pain that began two days ago. Pt denies nausea, emesis, and diarrhea. Pt is A/O x4, in NAD, and vitals are WDL.
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
## 2013-03-30 NOTE — Op Note (Signed)
AZALIAH CARRERO [id number]   PRE-OPERATIVE DIAGNOSIS:  acute appendicitis  POST-OPERATIVE DIAGNOSIS:  acute appendicitis  PROCEDURE:  APPENDECTOMY LAPAROSCOPIC  SURGEON: Leighton Ruff, MD  ASSISTANT: none   ANESTHESIA:   local and general  EBL:   42ml  Delay start of Pharmacological VTE agent (>24hrs) due to surgical blood loss or risk of bleeding:  no  DRAINS: none   SPECIMEN:  Source of Specimen:  appendix  DISPOSITION OF SPECIMEN:  PATHOLOGY  COUNTS:  YES  PLAN OF CARE: Admit for overnight observation  PATIENT DISPOSITION:  PACU - hemodynamically stable.   INDICATIONS: Patient with concerning symptoms & work up suspicious for appendicitis.  Surgery was recommended:  The anatomy & physiology of the digestive tract was discussed.  The pathophysiology of appendicitis was discussed.  Natural history risks without surgery was discussed.   I feel the risks of no intervention will lead to serious problems that outweigh the operative risks; therefore, I recommended diagnostic laparoscopy with removal of appendix to remove the pathology.  Laparoscopic & open techniques were discussed.   I noted a good likelihood this will help address the problem.    Risks such as bleeding, infection, abscess, leak, reoperation, possible ostomy, hernia, heart attack, death, and other risks were discussed.  Goals of post-operative recovery were discussed as well.  We will work to minimize complications.  Questions were answered.  The patient expresses understanding & wishes to proceed with surgery.  OR FINDINGS: acute appendicitis   DESCRIPTION:   The patient was identified & brought into the operating room. The patient was positioned supine with left arm tucked. SCDs were active during the entire case. The patient underwent general anesthesia without any difficulty.  A foley catheter was inserted under sterile conditions. The abdomen was prepped and draped in a sterile fashion. A Surgical Timeout  confirmed our plan.   I made a transverse incision through the inferior umbilical fold.  I made a nick in the infraumbilical fascia and confirmed peritoneal entry.  I placed a stay suture and then the [facility] port.  We induced carbon dioxide insufflation.  Camera inspection revealed no injury.  I placed additional ports under direct laparoscopic visualization.  I mobilized the terminal ileum to proximal ascending colon in a lateral to medial fashion.  I took care to avoid injuring any retroperitoneal structures.   I freed the appendix off its attachments to the ascending colon and cecal mesentery.  I elevated the appendix. I skeletonized & ligated the mesoappendix. I was able to free off the base of the appendix which was still viable.  I stapled the appendix off the cecum using a laparoscopic stapler. I took a healthy cuff viable cecum.  I placed the appendix inside an EndoCatch bag and removed out the [place] port.  The mesentery was stapled using a white load stapler.    I did copious irrigation. Hemostasis was good in the mesoappendix, colon mesentery, and retroperitoneum. Staple line was intact on the cecum with no bleeding.  The mesenteric staple line was bleeding.  3 clips were placed with the clip applier.  I washed out the pelvis, retrohepatic space and right paracolic gutter. I washed out the left side as well.  Hemostasis was good. There was no perforation or injury.  Because the area cleaned up well after irrigation, I did not place a drain.  I aspirated the carbon dioxide. I removed the ports. I closed the umbilical fascia site using a 0 Vicryl stitch. I closed skin using  4-0 vicryl stitch.  Sterile dressings were applied.  Patient was extubated and sent to the recovery room.  I discussed the operative findings with the patient's  family. I suspect the patient is going used in the hospital at least overnight and will need antibiotics for 0 days. Questions answered. They expressed understanding  and appreciation.

## 2013-03-31 MED ORDER — TRAMADOL HCL 50 MG PO TABS
50.0000 mg | ORAL_TABLET | Freq: Four times a day (QID) | ORAL | Status: DC | PRN
  Administered 2013-03-31: 100 mg via ORAL
  Filled 2013-03-31: qty 2

## 2013-03-31 MED ORDER — TRAMADOL HCL 50 MG PO TABS: 50.0000 mg | ORAL_TABLET | Freq: Four times a day (QID) | ORAL | Status: DC | PRN

## 2013-03-31 NOTE — Progress Notes (Signed)
1 Day Post-Op lap appy Subjective: Feeling better, a little sore, tolerating clears  Objective: Vital signs in last 24 hours: Temp:  [97.6 F (36.4 C)-98.6 F (37 C)] 98 F (36.7 C) (03/14 0605) Pulse Rate:  [67-91] 91 (03/14 0605) Resp:  [14-28] 18 (03/14 0605) BP: (112-150)/(50-80) 147/80 mmHg (03/14 0605) SpO2:  [94 %-100 %] 94 % (03/14 0605) Weight:  [130 lb (58.968 kg)] 130 lb (58.968 kg) (03/13 2230)   Intake/Output from previous day: 03/13 0701 - 03/14 0700 In: 1625 [I.V.:1625] Out: 1950 [Urine:1950] Intake/Output this shift:     General appearance: alert and cooperative GI: normal findings: soft, non-tender and non-distended  Incision: no significant drainage, bruising noted around incisions  Lab Results:   Recent Labs  03/30/13 1340  WBC 7.4  HGB 13.7  HCT 40.6  PLT 172   BMET  Recent Labs  03/30/13 1340  NA 131*  K 3.7  CL 93*  CO2 23  GLUCOSE 94  BUN 8  CREATININE 0.54  CALCIUM 9.4   PT/INR No results found for this basename: LABPROT, INR,  in the last 72 hours ABG No results found for this basename: PHART, PCO2, PO2, HCO3,  in the last 72 hours  MEDS, Scheduled . HYDROmorphone      . thyroid  45 mg Oral BID    Studies/Results: Ct Abdomen Pelvis W Contrast  03/30/2013   CLINICAL DATA:  Right lower quadrant pain  EXAM: CT ABDOMEN AND PELVIS WITH CONTRAST  TECHNIQUE: Multidetector CT imaging of the abdomen and pelvis was performed using the standard protocol following bolus administration of intravenous contrast.  CONTRAST:  57mL OMNIPAQUE IOHEXOL 300 MG/ML SOLN, 167mL OMNIPAQUE IOHEXOL 300 MG/ML SOLN  COMPARISON:  CT of the chest from 03/09/2011  FINDINGS: Lung bases are free of acute infiltrate or sizable effusion.  The gallbladder has been surgically removed. The spleen, adrenal glands and pancreas are within normal limits. The left lobe of the liver is small with some slight enlargement of the caudate lobe of the liver. No significant  nodularity is noted although this likely represents some mild underlying cirrhotic change. This is stable from the prior exam. 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 When compared with the prior exam these are stable in appearance. No significant gastric or esophageal varices are seen.  Kidneys demonstrate a normal enhancement pattern. Normal excretion is noted on delayed images. The bladder is partially decompressed. A left ovarian cyst is noted measuring 3 cm. The uterus has been surgically removed. No pelvic mass lesion or sidewall abnormality is seen. No acute bony abnormality is noted.  The appendix is well visualized and demonstrates some mild periappendiceal inflammatory changes. It measures 10 mm in diameter. No evidence of perforation is noted although these changes likely represent some very early acute appendicitis.  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  Changes of very early appendicitis are noted in the right lower quadrant. No evidence of perforation is seen. Correlation with the white blood cell count is recommended.   Electronically Signed   By: Inez Catalina M.D.   On: 03/30/2013 17:04    Assessment: s/p Procedure(s): APPENDECTOMY LAPAROSCOPIC Patient Active Problem List   Diagnosis Date Noted  . Appendicitis 03/30/2013  . HYPOTHYROIDISM 02/27/2009  . GERD 02/27/2009  . IRRITABLE BOWEL SYNDROME 02/27/2009  . CHOLELITHIASIS 02/27/2009  . SYSTEMIC LUPUS ERYTHEMATOSUS 02/27/2009  . DYSPHAGIA  02/27/2009    Expected post op course  Plan: Advance diet Discharge today if tolerates a diet and PO pain meds   LOS: 1 day     .Rosario Adie, MD [facility], [place]   03/31/2013 8:12 AM

## 2013-03-31 NOTE — Discharge Summary (Signed)
Physician Discharge Summary  Patient ID: Julia Day MRN: [id number] DOB/AGE: [birth year] 63 y.o.  Admit date: 03/30/2013 Discharge date: 03/31/2013  Admission Diagnoses: appendicitis   Discharge Diagnoses:  Active Problems:   Appendicitis   Discharged Condition: good  Hospital Course: The patient was admitted after laparoscopic appendectomy. Her pain was controlled overnight with IV narcotics. The following day she was advanced to a soft diet. She was given by mouth pain medications as well. She tolerated these well and was ready for discharge later that day.  Consults: None  Significant Diagnostic Studies: labs: cbc, chemistry  Treatments: IV hydration, antibiotics: invanz and analgesia: Morphine  Discharge Exam: Blood pressure 147/80, pulse 91, temperature 98 F (36.7 C), temperature source Oral, resp. rate 18, height 5\' 4"  (1.626 m), weight 130 lb (58.968 kg), SpO2 94.00%. General appearance: alert and cooperative GI: normal findings: soft, non-tender Incision/Wound: clean, dry, bruising noted  Disposition: Home     Medication List         multivitamin with minerals Tabs tablet  Take 1 tablet by mouth daily.     NATURE-THROID 48.75 MG Tabs  Generic drug:  Thyroid  Take 48.75 mg by mouth 2 (two) times daily.     PHILLIPS MILK OF MAGNESIA PO  Take 2-3 tablets by mouth at bedtime.     traMADol 50 MG tablet  Commonly known as:  ULTRAM  Take 50 mg by mouth every 6 (six) hours as needed. For pain     traMADol 50 MG tablet  Commonly known as:  ULTRAM  Take 1-2 tablets (50-100 mg total) by mouth every 6 (six) hours as needed for moderate pain or severe pain.     Vitamin D 2000 UNITS Caps  Take 2,000 Units by mouth daily.           Follow-up Information   Follow up with Rosario Adie., MD. Schedule an appointment as soon as possible for a visit in 2 weeks.   Specialty:  General Surgery   Contact information:   [address] [place]  [place] [address] [phone]       Signed: Rosario Adie 0/16/0109, 3:23 AM

## 2013-03-31 NOTE — Progress Notes (Signed)
Assessment unchanged. Pt verbalized understanding of dc instructions including follow up care via teach back. No scripts at discharge. My Chart discussed and pt planning to sign up soon. Discharged via wc to front entrance to meet awaiting vehicle to carry home. Accompanied by husband and NT.

## 2013-03-31 NOTE — Discharge Instructions (Signed)
LAPAROSCOPIC SURGERY: POST OP INSTRUCTIONS ° °1. DIET: Follow a light bland diet the first 24 hours after arrival home, such as soup, liquids, crackers, etc.  Be sure to include lots of fluids daily.  Avoid fast food or heavy meals as your are more likely to get nauseated.  Eat a low fat the next few days after surgery.   °2. Take your usually prescribed home medications unless otherwise directed. °3. PAIN CONTROL: °a. Pain is best controlled by a usual combination of three different methods TOGETHER: °i. Ice/Heat °ii. Over the counter pain medication °iii. Prescription pain medication °b. Most patients will experience some swelling and bruising around the incisions.  Ice packs or heating pads (30-60 minutes up to 6 times a day) will help. Use ice for the first few days to help decrease swelling and bruising, then switch to heat to help relax tight/sore spots and speed recovery.  Some people prefer to use ice alone, heat alone, alternating between ice & heat.  Experiment to what works for you.  Swelling and bruising can take several weeks to resolve.   °c. It is helpful to take an over-the-counter pain medication regularly for the first few weeks.  Choose one of the following that works best for you: °i. Naproxen (Aleve, etc)  Two 220mg tabs twice a day °ii. Ibuprofen (Advil, etc) Three 200mg tabs four times a day (every meal & bedtime) °d. A  prescription for pain medication (such as percocet, vicodin, oxycodone, hydrocodone, etc) should be given to you upon discharge.  Take your pain medication as prescribed.  °i. If you are having problems/concerns with the prescription medicine (does not control pain, nausea, vomiting, rash, itching, etc), please call us [phone] to see if we need to switch you to a different pain medicine that will work better for you and/or control your side effect better. °ii. If you need a refill on your pain medication, please contact your pharmacy.  They will contact our office to  request authorization. Prescriptions will not be filled after 5 pm or on week-ends. ° ° °4. Avoid getting constipated.  Between the surgery and the pain medications, it is common to experience some constipation.  Increasing fluid intake and taking a fiber supplement (such as Metamucil, Citrucel, FiberCon, MiraLax, etc) 1-2 times a day regularly will usually help prevent this problem from occurring.  A mild laxative (prune juice, Milk of Magnesia, MiraLax, etc) should be taken according to package directions if there are no bowel movements after 48 hours.   °5. Watch out for diarrhea.  If you have many loose bowel movements, simplify your diet to bland foods & liquids for a few days.  Stop any stool softeners and decrease your fiber supplement.  Switching to mild anti-diarrheal medications (Kayopectate, Pepto Bismol) can help.  If this worsens or does not improve, please call us. °6. Wash / shower every day.  You may shower over the dressings as they are waterproof.  Continue to shower over incision(s) after the dressing is off. °7. Remove your waterproof bandages 5 days after surgery.  You may leave the incision open to air.  You may replace a dressing/Band-Aid to cover the incision for comfort if you wish.  °8. ACTIVITIES as tolerated:   °a. You may resume regular (light) daily activities beginning the next day--such as daily self-care, walking, climbing stairs--gradually increasing activities as tolerated.  If you can walk 30 minutes without difficulty, it is safe to try more intense activity such as   jogging, treadmill, bicycling, low-impact aerobics, swimming, etc. b. Save the most intensive and strenuous activity for last such as sit-ups, heavy lifting, contact sports, etc  Refrain from any heavy lifting or straining until you are off narcotics for pain control.   c. DO NOT PUSH THROUGH PAIN.  Let pain be your guide: If it hurts to do something, don't do it.  Pain is your body warning you to avoid that  activity for another week until the pain goes down. d. You may drive when you are no longer taking prescription pain medication, you can comfortably wear a seatbelt, and you can safely maneuver your car and apply brakes. e. Dennis Bast may have sexual intercourse when it is comfortable.  9. FOLLOW UP in our office a. Please call [facility] at [phone] to set up an appointment to see your surgeon in the office for a follow-up appointment approximately 2-3 weeks after your surgery. b. Make sure that you call for this appointment the day you arrive home to insure a convenient appointment time. 10. IF YOU HAVE DISABILITY OR FAMILY LEAVE FORMS, BRING THEM TO THE OFFICE FOR PROCESSING.  DO NOT GIVE THEM TO YOUR DOCTOR.   WHEN TO CALL us [phone]: 1. Poor pain control 2. Reactions / problems with new medications (rash/itching, nausea, etc)  3. Fever over 101.5 F (38.5 C) 4. Inability to urinate 5. Nausea and/or vomiting 6. Worsening swelling or bruising 7. Continued bleeding from incision. 8. Increased pain, redness, or drainage from the incision   The clinic staff is available to answer your questions during regular business hours (8:30am-5pm).  Please dont hesitate to call and ask to speak to one of our nurses for clinical concerns.   If you have a medical emergency, go to the nearest emergency room or call 911.  A surgeon from [facility] is always on call at the [facility], [address], [place] [address], [place], [place]  [address] ? MAIN: [phone] ? TOLL FREE: [phone] ?  FAX [phone] [email or web address]   Appendicitis Appendicitis is when the appendix is swollen (inflamed). The inflammation can lead to developing a hole (perforation) and a collection of pus (abscess). CAUSES  There is not always an obvious cause of appendicitis. Sometimes it is caused by an obstruction in the appendix. The obstruction can be caused  by:  A small, hard, pea-sized ball of stool (fecalith).  Enlarged lymph glands in the appendix. SYMPTOMS   Pain around your belly button (navel) that moves toward your lower right belly (abdomen). The pain can become more severe and sharp as time passes.  Tenderness in the lower right abdomen. Pain gets worse if you cough or make a sudden movement.  Feeling sick to your stomach (nauseous).  Throwing up (vomiting).  Loss of appetite.  Fever.  Constipation.  Diarrhea.  Generally not feeling well. DIAGNOSIS   Physical exam.  Blood tests.  Urine test.  X-rays or a CT scan may confirm the diagnosis. TREATMENT  Once the diagnosis of appendicitis is made, the most common treatment is to remove the appendix as soon as possible. This procedure is called appendectomy. In an open appendectomy, a cut (incision) is made in the lower right abdomen and the appendix is removed. In a laparoscopic appendectomy, usually 3 small incisions are made. Long, thin instruments and a camera tube are used to remove the appendix. Most patients go home in 24 to 48 hours after appendectomy. In some  situations, the appendix may have already perforated and an abscess may have formed. The abscess may have a "wall" around it as seen on a CT scan. In this case, a drain may be placed into the abscess to remove fluid, and you may be treated with antibiotic medicines that kill germs. The medicine is given through a tube in your vein (IV). Once the abscess has resolved, it may or may not be necessary to have an appendectomy. You may need to stay in the hospital longer than 48 hours. Document Released: 01/04/2005 Document Revised: 07/06/2011 Document Reviewed: 04/01/2009 [facility] Patient Information 2014 [place].

## 2013-04-01 NOTE — Anesthesia Postprocedure Evaluation (Signed)
Anesthesia Post Note  Patient: Julia Day  Procedure(s) Performed: Procedure(s) (LRB): APPENDECTOMY LAPAROSCOPIC (N/A)  Anesthesia type: General  Patient location: PACU  Post pain: Pain level controlled  Post assessment: Post-op Vital signs reviewed  Last Vitals: BP 147/80  Pulse 91  Temp(Src) 36.7 C (Oral)  Resp 18  Ht 5\' 4"  (1.626 m)  Wt 130 lb (58.968 kg)  BMI 22.30 kg/m2  SpO2 94%  Post vital signs: Reviewed  Level of consciousness: sedated  Complications: No apparent anesthesia complications

## 2013-04-01 NOTE — ED Provider Notes (Signed)
Medical screening examination/treatment/procedure(s) were performed by non-physician practitioner and as supervising physician I was immediately available for consultation/collaboration.   EKG Interpretation None       Virgel Manifold, MD 04/01/13 2001

## 2013-04-02 ENCOUNTER — Encounter (HOSPITAL_COMMUNITY): Payer: Self-pay | Admitting: General Surgery

## 2013-04-03 ENCOUNTER — Telehealth (INDEPENDENT_AMBULATORY_CARE_PROVIDER_SITE_OTHER): Payer: Self-pay

## 2013-04-03 ENCOUNTER — Encounter (INDEPENDENT_AMBULATORY_CARE_PROVIDER_SITE_OTHER): Payer: Self-pay

## 2013-04-03 NOTE — Telephone Encounter (Signed)
Inform pt of post op appointment on May 01, 2013 at 9:30am

## 2013-04-24 ENCOUNTER — Encounter (INDEPENDENT_AMBULATORY_CARE_PROVIDER_SITE_OTHER): Payer: Self-pay | Admitting: General Surgery

## 2013-04-24 ENCOUNTER — Ambulatory Visit (INDEPENDENT_AMBULATORY_CARE_PROVIDER_SITE_OTHER): Payer: BC Managed Care – PPO | Admitting: General Surgery

## 2013-04-24 VITALS — BP 132/80 | HR 78 | Temp 98.3°F | Resp 12 | Ht 64.0 in | Wt 131.4 lb

## 2013-04-24 DIAGNOSIS — Z9889 Other specified postprocedural states: Secondary | ICD-10-CM

## 2013-04-24 NOTE — Progress Notes (Signed)
Julia Day is a 63 y.o. female who is status post a Laparoscopic appendectomy on 03/30/2013.  She is doing well. She complains of some vague right upper quadrant pain that gets better as she moves around. She is having regular bowel movements and tolerating a diet without difficulty. She did have a episode of weakness and partial paralysis approximately 24 hours after going home but this resolved with rest. She has had no trouble with weakness in her muscles since then.  Objective: Filed Vitals:   04/24/13 0948  BP: 132/80  Pulse: 78  Temp: 98.3 F (36.8 C)  Resp: 12    General appearance: alert and cooperative GI: normal findings: soft, non-tender Extremities: normal strength in all 4 extremities   Incision: healing well   Assessment: s/p  Patient Active Problem List   Diagnosis Date Noted  . Appendicitis 03/30/2013  . HYPOTHYROIDISM 02/27/2009  . GERD 02/27/2009  . IRRITABLE BOWEL SYNDROME 02/27/2009  . CHOLELITHIASIS 02/27/2009  . SYSTEMIC LUPUS ERYTHEMATOSUS 02/27/2009  . DYSPHAGIA 02/27/2009    Plan: Julia Day Is a 63 year old female who is status post microscopic appendectomy. She tolerated this well. She feels that this surgery recovery went much better than her gallbladder recovery.  We were reviewed her pathology which was negative for any malignancy. Her incision seem to be healing well. I will see her back on an as-needed basis.    Julia Day, [facility], [place]   04/24/2013 10:31 AM

## 2013-04-24 NOTE — Patient Instructions (Signed)
Return to the office as needed 

## 2013-05-01 ENCOUNTER — Encounter (INDEPENDENT_AMBULATORY_CARE_PROVIDER_SITE_OTHER): Payer: BC Managed Care – PPO | Admitting: General Surgery

## 2013-09-20 ENCOUNTER — Emergency Department (HOSPITAL_BASED_OUTPATIENT_CLINIC_OR_DEPARTMENT_OTHER): Payer: BC Managed Care – PPO

## 2013-09-20 ENCOUNTER — Emergency Department (HOSPITAL_BASED_OUTPATIENT_CLINIC_OR_DEPARTMENT_OTHER)
Admission: EM | Admit: 2013-09-20 | Discharge: 2013-09-20 | Disposition: A | Discharge status: Home / Self Care | Payer: BC Managed Care – PPO | Attending: Emergency Medicine | Admitting: Emergency Medicine

## 2013-09-20 ENCOUNTER — Encounter (HOSPITAL_BASED_OUTPATIENT_CLINIC_OR_DEPARTMENT_OTHER): Payer: Self-pay | Admitting: Emergency Medicine

## 2013-09-20 DIAGNOSIS — Y9389 Activity, other specified: Secondary | ICD-10-CM | POA: Insufficient documentation

## 2013-09-20 DIAGNOSIS — X58XXXA Exposure to other specified factors, initial encounter: Secondary | ICD-10-CM | POA: Insufficient documentation

## 2013-09-20 DIAGNOSIS — Z8679 Personal history of other diseases of the circulatory system: Secondary | ICD-10-CM | POA: Insufficient documentation

## 2013-09-20 DIAGNOSIS — S298XXA Other specified injuries of thorax, initial encounter: Secondary | ICD-10-CM | POA: Diagnosis present

## 2013-09-20 DIAGNOSIS — Z79899 Other long term (current) drug therapy: Secondary | ICD-10-CM | POA: Insufficient documentation

## 2013-09-20 DIAGNOSIS — S20211A Contusion of right front wall of thorax, initial encounter: Secondary | ICD-10-CM

## 2013-09-20 DIAGNOSIS — Z8719 Personal history of other diseases of the digestive system: Secondary | ICD-10-CM | POA: Insufficient documentation

## 2013-09-20 DIAGNOSIS — Z8739 Personal history of other diseases of the musculoskeletal system and connective tissue: Secondary | ICD-10-CM | POA: Diagnosis not present

## 2013-09-20 DIAGNOSIS — E039 Hypothyroidism, unspecified: Secondary | ICD-10-CM | POA: Diagnosis not present

## 2013-09-20 DIAGNOSIS — S20219A Contusion of unspecified front wall of thorax, initial encounter: Secondary | ICD-10-CM | POA: Diagnosis not present

## 2013-09-20 DIAGNOSIS — Y9289 Other specified places as the place of occurrence of the external cause: Secondary | ICD-10-CM | POA: Diagnosis not present

## 2013-09-20 NOTE — Discharge Instructions (Signed)
Take Tramadol as needed for pain.  Rib Contusion A rib contusion (bruise) can occur by a blow to the chest or by a fall against a hard object. Usually these will be much better in a couple weeks. If X-rays were taken today and there are no broken bones (fractures), the diagnosis of bruising is made. However, broken ribs may not show up for several days, or may be discovered later on a routine X-ray when signs of healing show up. If this happens to you, it does not mean that something was missed on the X-ray, but simply that it did not show up on the first X-rays. Earlier diagnosis will not usually change the treatment. HOME CARE INSTRUCTIONS   Avoid strenuous activity. Be careful during activities and avoid bumping the injured ribs. Activities that pull on the injured ribs and cause pain should be avoided, if possible.  For the first day or two, an ice pack used every 20 minutes while awake may be helpful. Put ice in a plastic bag and put a towel between the bag and the skin.  Eat a normal, well-balanced diet. Drink plenty of fluids to avoid constipation.  Take deep breaths several times a day to keep lungs free of infection. Try to cough several times a day. Splint the injured area with a pillow while coughing to ease pain. Coughing can help prevent pneumonia.  Wear a rib belt or binder only if told to do so by your caregiver. If you are wearing a rib belt or binder, you must do the breathing exercises as directed by your caregiver. If not used properly, rib belts or binders restrict breathing which can lead to pneumonia.  Only take over-the-counter or prescription medicines for pain, discomfort, or fever as directed by your caregiver. SEEK MEDICAL CARE IF:   You or your child has an oral temperature above 102 F (38.9 C).  Your baby is older than 3 months with a rectal temperature of 100.5 F (38.1 C) or higher for more than 1 day.  You develop a cough, with thick or bloody sputum. SEEK  IMMEDIATE MEDICAL CARE IF:   You have difficulty breathing.  You feel sick to your stomach (nausea), have vomiting or belly (abdominal) pain.  You have worsening pain, not controlled with medications, or there is a change in the location of the pain.  You develop sweating or radiation of the pain into the arms, jaw or shoulders, or become light headed or faint.  You or your child has an oral temperature above 102 F (38.9 C), not controlled by medicine.  Your or your baby is older than 3 months with a rectal temperature of 102 F (38.9 C) or higher.  Your baby is 31 months old or younger with a rectal temperature of 100.4 F (38 C) or higher. MAKE SURE YOU:   Understand these instructions.  Will watch your condition.  Will get help right away if you are not doing well or get worse. Document Released: 09/29/2000 Document Revised: 05/01/2012 Document Reviewed: 08/23/2007 [facility] Patient Information 2015 [place], [place]. This information is not intended to replace advice given to you by your health care provider. Make sure you discuss any questions you have with your health care provider.
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
## 2013-11-20 ENCOUNTER — Ambulatory Visit: Payer: BC Managed Care – PPO | Admitting: Cardiology

## 2013-12-28 ENCOUNTER — Ambulatory Visit (INDEPENDENT_AMBULATORY_CARE_PROVIDER_SITE_OTHER): Payer: BC Managed Care – PPO | Admitting: Cardiovascular Disease

## 2013-12-28 ENCOUNTER — Encounter: Payer: Self-pay | Admitting: Cardiovascular Disease

## 2013-12-28 VITALS — BP 138/80 | HR 71 | Ht 64.0 in | Wt 133.8 lb

## 2013-12-28 DIAGNOSIS — E039 Hypothyroidism, unspecified: Secondary | ICD-10-CM

## 2013-12-28 DIAGNOSIS — I10 Essential (primary) hypertension: Secondary | ICD-10-CM

## 2013-12-28 NOTE — Assessment & Plan Note (Signed)
Julia Day  presents today for further evaluation of her intermittent hypertension. She has episodes of elevated blood pressure almost always related to mental stress.  She's been on magnesium supplement and has been doing much better. She exercises as much as she can. She has a heel spur and is not able to do much.  Ive Reassured her that her blood pressure readings are completely normal today and I do not think that she needs to be on any medications at this time. She'll continue to see Dr. Tye Savoy on a regular basis. I'll see her on an as-needed basis.
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
## 2013-12-28 NOTE — Patient Instructions (Signed)
Your physician recommends that you continue on your current medications as directed. Please refer to the Current Medication list given to you today.  Your physician recommends that you schedule a follow-up appointment in: as needed with Dr. Nahser  

## 2014-06-05 ENCOUNTER — Encounter: Payer: Self-pay | Admitting: Gastroenterology

## 2014-07-08 ENCOUNTER — Other Ambulatory Visit: Payer: Self-pay

## 2014-07-08 DIAGNOSIS — Z1231 Encounter for screening mammogram for malignant neoplasm of breast: Secondary | ICD-10-CM

## 2014-07-26 ENCOUNTER — Ambulatory Visit: Payer: BC Managed Care – PPO

## 2019-01-24 ENCOUNTER — Encounter: Payer: Self-pay | Admitting: Gastroenterology

## 2019-03-06 ENCOUNTER — Encounter: Payer: Self-pay | Admitting: Gastroenterology

## 2019-03-06 ENCOUNTER — Other Ambulatory Visit (INDEPENDENT_AMBULATORY_CARE_PROVIDER_SITE_OTHER): Payer: Medicare PPO

## 2019-03-06 ENCOUNTER — Ambulatory Visit: Payer: Medicare PPO | Admitting: Gastroenterology

## 2019-03-06 VITALS — BP 128/80 | HR 80 | Temp 97.9°F | Ht 64.0 in | Wt 129.0 lb

## 2019-03-06 DIAGNOSIS — K219 Gastro-esophageal reflux disease without esophagitis: Secondary | ICD-10-CM

## 2019-03-06 DIAGNOSIS — K9041 Non-celiac gluten sensitivity: Secondary | ICD-10-CM

## 2019-03-06 DIAGNOSIS — R195 Other fecal abnormalities: Secondary | ICD-10-CM

## 2019-03-06 DIAGNOSIS — K743 Primary biliary cirrhosis: Secondary | ICD-10-CM

## 2019-03-06 DIAGNOSIS — R1013 Epigastric pain: Secondary | ICD-10-CM

## 2019-03-06 LAB — CBC WITH DIFFERENTIAL/PLATELET
Basophils Absolute: 0 10*3/uL (ref 0.0–0.1)
Basophils Relative: 0.5 % (ref 0.0–3.0)
Eosinophils Absolute: 0.1 10*3/uL (ref 0.0–0.7)
Eosinophils Relative: 1.3 % (ref 0.0–5.0)
HCT: 41.9 % (ref 36.0–46.0)
Hemoglobin: 13.9 g/dL (ref 12.0–15.0)
Lymphocytes Relative: 21.9 % (ref 12.0–46.0)
Lymphs Abs: 1.3 10*3/uL (ref 0.7–4.0)
MCHC: 33.3 g/dL (ref 30.0–36.0)
MCV: 89.8 fl (ref 78.0–100.0)
Monocytes Absolute: 0.4 10*3/uL (ref 0.1–1.0)
Monocytes Relative: 6.5 % (ref 3.0–12.0)
Neutro Abs: 4.2 10*3/uL (ref 1.4–7.7)
Neutrophils Relative %: 69.8 % (ref 43.0–77.0)
Platelets: 207 10*3/uL (ref 150.0–400.0)
RBC: 4.67 Mil/uL (ref 3.87–5.11)
RDW: 12.5 % (ref 11.5–15.5)
WBC: 6.1 10*3/uL (ref 4.0–10.5)

## 2019-03-06 LAB — COMPREHENSIVE METABOLIC PANEL
ALT: 29 U/L (ref 0–35)
AST: 32 U/L (ref 0–37)
Albumin: 4.3 g/dL (ref 3.5–5.2)
Alkaline Phosphatase: 74 U/L (ref 39–117)
BUN: 11 mg/dL (ref 6–23)
CO2: 26 mEq/L (ref 19–32)
Calcium: 9.6 mg/dL (ref 8.4–10.5)
Chloride: 102 mEq/L (ref 96–112)
Creatinine, Ser: 0.68 mg/dL (ref 0.40–1.20)
GFR: 85.92 mL/min (ref >60.00)
Glucose, Bld: 101 mg/dL — ABNORMAL HIGH (ref 70–99)
Potassium: 3.9 mEq/L (ref 3.5–5.1)
Sodium: 136 mEq/L (ref 135–145)
Total Bilirubin: 0.5 mg/dL (ref 0.2–1.2)
Total Protein: 7.7 g/dL (ref 6.0–8.3)

## 2019-03-06 LAB — PROTIME-INR
INR: 1.2 ratio — ABNORMAL HIGH (ref 0.8–1.0)
Prothrombin Time: 12.9 s (ref 9.6–13.1)

## 2019-03-06 LAB — SEDIMENTATION RATE: Sed Rate: 71 mm/hr — ABNORMAL HIGH (ref 0–30)

## 2019-03-06 MED ORDER — SUPREP BOWEL PREP KIT 17.5-3.13-1.6 GM/177ML PO SOLN: 1.0000 | Freq: Once | ORAL | 0 refills | Status: AC

## 2019-03-06 NOTE — Patient Instructions (Addendum)
If you are age 69 or older, your body mass index should be between 23-30. Your Body mass index is 22.14 kg/m. If this is out of the aforementioned range listed, please consider follow up with your Primary Care Provider.  If you are age 24 or younger, your body mass index should be between 19-25. Your Body mass index is 22.14 kg/m. If this is out of the aformentioned range listed, please consider follow up with your Primary Care Provider.    You have been scheduled for an endoscopy and colonoscopy. Please follow the written instructions given to you at your visit today. Please pick up your prep supplies at the pharmacy within the next 1-3 days. If you use inhalers (even only as needed), please bring them with you on the day of your procedure.   You will need to contact your insurance to see if they will cover the Ceiac panel test   Your provider has requested that you go to the basement level for lab work before leaving today. Press "B" on the elevator. The lab is located at the first door on the left as you exit the elevator.   Follow up in 6 months   I appreciate the  opportunity to care for you  Thank You   Harl Bowie , MD
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
## 2019-03-07 ENCOUNTER — Encounter: Payer: Self-pay | Admitting: Gastroenterology

## 2019-03-16 ENCOUNTER — Encounter: Payer: Self-pay | Admitting: Gastroenterology

## 2019-03-22 ENCOUNTER — Ambulatory Visit (AMBULATORY_SURGERY_CENTER): Payer: Medicare PPO | Admitting: Gastroenterology

## 2019-03-22 ENCOUNTER — Encounter: Payer: Self-pay | Admitting: Gastroenterology

## 2019-03-22 ENCOUNTER — Other Ambulatory Visit: Payer: Self-pay

## 2019-03-22 VITALS — BP 120/64 | HR 74 | Temp 96.8°F | Resp 12 | Ht 64.0 in | Wt 129.0 lb

## 2019-03-22 DIAGNOSIS — R195 Other fecal abnormalities: Secondary | ICD-10-CM

## 2019-03-22 DIAGNOSIS — K2950 Unspecified chronic gastritis without bleeding: Secondary | ICD-10-CM

## 2019-03-22 DIAGNOSIS — K743 Primary biliary cirrhosis: Secondary | ICD-10-CM

## 2019-03-22 DIAGNOSIS — K648 Other hemorrhoids: Secondary | ICD-10-CM | POA: Diagnosis not present

## 2019-03-22 DIAGNOSIS — K5730 Diverticulosis of large intestine without perforation or abscess without bleeding: Secondary | ICD-10-CM | POA: Diagnosis not present

## 2019-03-22 DIAGNOSIS — K621 Rectal polyp: Secondary | ICD-10-CM

## 2019-03-22 DIAGNOSIS — K2980 Duodenitis without bleeding: Secondary | ICD-10-CM

## 2019-03-22 DIAGNOSIS — R1013 Epigastric pain: Secondary | ICD-10-CM

## 2019-03-22 DIAGNOSIS — G8929 Other chronic pain: Secondary | ICD-10-CM

## 2019-03-22 DIAGNOSIS — K2970 Gastritis, unspecified, without bleeding: Secondary | ICD-10-CM

## 2019-03-22 DIAGNOSIS — D128 Benign neoplasm of rectum: Secondary | ICD-10-CM

## 2019-03-22 MED ORDER — SODIUM CHLORIDE 0.9 % IV SOLN: 500.0000 mL | Freq: Once | INTRAVENOUS | Status: DC

## 2019-03-22 NOTE — Progress Notes (Signed)
Called to room to assist during endoscopic procedure.  Patient ID and intended procedure confirmed with present staff. Received instructions for my participation in the procedure from the performing physician.  

## 2019-03-22 NOTE — Op Note (Signed)
[facility] Patient Name: Julia Day Procedure Date: 03/22/2019 9:52 AM MRN: [id number] Endoscopist: Mauri Pole , MD Age: 69 Referring MD:  Date of Birth: 24-Jul-1950 Gender: Female Account #: [id number] Procedure:                Upper GI endoscopy Indications:              Epigastric abdominal pain, Dyspepsia Medicines:                Monitored Anesthesia Care Procedure:                Pre-Anesthesia Assessment:                           - Prior to the procedure, a History and Physical                            was performed, and patient medications and                            allergies were reviewed. The patient's tolerance of                            previous anesthesia was also reviewed. The risks                            and benefits of the procedure and the sedation                            options and risks were discussed with the patient.                            All questions were answered, and informed consent                            was obtained. Prior Anticoagulants: The patient has                            taken no previous anticoagulant or antiplatelet                            agents. ASA Grade Assessment: III - A patient with                            severe systemic disease. After reviewing the risks                            and benefits, the patient was deemed in                            satisfactory condition to undergo the procedure.                           After obtaining informed consent, the endoscope was  passed under direct vision. Throughout the                            procedure, the patient's blood pressure, pulse, and                            oxygen saturations were monitored continuously. The                            Endoscope was introduced through the mouth, and                            advanced to the second part of duodenum. The upper                            GI endoscopy  was accomplished without difficulty.                            The patient tolerated the procedure well. Scope In: Scope Out: Findings:                 The Z-line was regular and was found 35 cm from the                            incisors.                           No gross lesions were noted in the entire                            esophagus. No esophageal varices.                           Patchy mild inflammation characterized by adherent                            blood, congestion (edema), erosions, erythema and                            mucus was found in the entire examined stomach.                            Biopsies were taken with a cold forceps for                            Helicobacter pylori testing. No gastric varices.                           Patchy mildly erythematous mucosa without active                            bleeding and with no stigmata of bleeding was found                            in the  first portion of the duodenum and in the                            second portion of the duodenum. Biopsies for                            histology were taken with a cold forceps for                            evaluation of celiac disease. Complications:            No immediate complications. Estimated Blood Loss:     Estimated blood loss was minimal. Impression:               - Z-line regular, 35 cm from the incisors.                           - No gross lesions in esophagus.                           - Gastritis. Biopsied.                           - Erythematous duodenopathy. Biopsied. Recommendation:           - Patient has a contact number available for                            emergencies. The signs and symptoms of potential                            delayed complications were discussed with the                            patient. Return to normal activities tomorrow.                            Written discharge instructions were provided to the                             patient.                           - Resume previous diet.                           - Continue present medications.                           - Await pathology results.                           - No aspirin, ibuprofen, naproxen, or other                            non-steroidal anti-inflammatory drugs.                           -  See the other procedure note for documentation of                            additional recommendations. Mauri Pole, MD 03/22/2019 10:31:45 AM This report has been signed electronically.

## 2019-03-22 NOTE — Progress Notes (Signed)
Temp JB Vitals KA

## 2019-03-22 NOTE — Progress Notes (Signed)
Pt Drowsy. VSS. To PACU, report to RN. No anesthetic complications noted.  

## 2019-03-22 NOTE — Patient Instructions (Signed)
Handout on polyps, diverticulosis, hemorrhoids given. No aspirin, ibuprofen, naproxen or other non-steroidal anti-inflammatory drugs. Take Tylenol if needed for pain.    YOU HAD AN ENDOSCOPIC PROCEDURE TODAY AT [facility]:   Refer to the procedure report that was given to you for any specific questions about what was found during the examination.  If the procedure report does not answer your questions, please call your gastroenterologist to clarify.  If you requested that your care partner not be given the details of your procedure findings, then the procedure report has been included in a sealed envelope for you to review at your convenience later.  YOU SHOULD EXPECT: Some feelings of bloating in the abdomen. Passage of more gas than usual.  Walking can help get rid of the air that was put into your GI tract during the procedure and reduce the bloating. If you had a lower endoscopy (such as a colonoscopy or flexible sigmoidoscopy) you may notice spotting of blood in your stool or on the toilet paper. If you underwent a bowel prep for your procedure, you may not have a normal bowel movement for a few days.  Please Note:  You might notice some irritation and congestion in your nose or some drainage.  This is from the oxygen used during your procedure.  There is no need for concern and it should clear up in a day or so.  SYMPTOMS TO REPORT IMMEDIATELY:   Following lower endoscopy (colonoscopy or flexible sigmoidoscopy):  Excessive amounts of blood in the stool  Significant tenderness or worsening of abdominal pains  Swelling of the abdomen that is new, acute  Fever of 100F or higher   Following upper endoscopy (EGD)  Vomiting of blood or coffee ground material  New chest pain or pain under the shoulder blades  Painful or persistently difficult swallowing  New shortness of breath  Fever of 100F or higher  Black, tarry-looking stools  For urgent or emergent issues, a  gastroenterologist can be reached at any hour by calling [phone]. Do not use MyChart messaging for urgent concerns.    DIET:  We do recommend a small meal at first, but then you may proceed to your regular diet.  Drink plenty of fluids but you should avoid alcoholic beverages for 24 hours.  ACTIVITY:  You should plan to take it easy for the rest of today and you should NOT DRIVE or use heavy machinery until tomorrow (because of the sedation medicines used during the test).    FOLLOW UP: Our staff will call the number listed on your records 48-72 hours following your procedure to check on you and address any questions or concerns that you may have regarding the information given to you following your procedure. If we do not reach you, we will leave a message.  We will attempt to reach you two times.  During this call, we will ask if you have developed any symptoms of COVID 19. If you develop any symptoms (ie: fever, flu-like symptoms, shortness of breath, cough etc.) before then, please call [phone].  If you test positive for Covid 19 in the 2 weeks post procedure, please call and report this information to [place].    If any biopsies were taken you will be contacted by phone or by letter within the next 1-3 weeks.  Please call us at [phone] if you have not heard about the biopsies in 3 weeks.    SIGNATURES/CONFIDENTIALITY: You and/or your care partner have  signed paperwork which will be entered into your electronic medical record.  These signatures attest to the fact that that the information above on your After Visit Summary has been reviewed and is understood.  Full responsibility of the confidentiality of this discharge information lies with you and/or your care-partner.

## 2019-03-22 NOTE — Op Note (Signed)
[facility] Patient Name: Zenna Spiller Procedure Date: 03/22/2019 9:52 AM MRN: [id number] Endoscopist: Mauri Pole , MD Age: 69 Referring MD:  Date of Birth: [birth year] Gender: Female Account #: [id number] Procedure:                Colonoscopy Indications:              Positive Cologuard test Medicines:                Monitored Anesthesia Care Procedure:                Pre-Anesthesia Assessment:                           - Prior to the procedure, a History and Physical                            was performed, and patient medications and                            allergies were reviewed. The patient's tolerance of                            previous anesthesia was also reviewed. The risks                            and benefits of the procedure and the sedation                            options and risks were discussed with the patient.                            All questions were answered, and informed consent                            was obtained. Prior Anticoagulants: The patient has                            taken no previous anticoagulant or antiplatelet                            agents. ASA Grade Assessment: III - A patient with                            severe systemic disease. After reviewing the risks                            and benefits, the patient was deemed in                            satisfactory condition to undergo the procedure.                           After obtaining informed consent, the colonoscope  was passed under direct vision. Throughout the                            procedure, the patient's blood pressure, pulse, and                            oxygen saturations were monitored continuously. The                            Colonoscope was introduced through the anus and                            advanced to the the cecum, identified by                            appendiceal orifice and ileocecal valve.  The                            colonoscopy was performed without difficulty. The                            patient tolerated the procedure well. The quality                            of the bowel preparation was good. The ileocecal                            valve, appendiceal orifice, and rectum were                            photographed. Scope In: 10:06:55 AM Scope Out: 10:23:37 AM Scope Withdrawal Time: 0 hours 12 minutes 32 seconds  Total Procedure Duration: 0 hours 16 minutes 42 seconds  Findings:                 The perianal and digital rectal examinations were                            normal.                           Scattered small and large-mouthed diverticula were                            found in the sigmoid colon, descending colon,                            transverse colon and ascending colon.                           Three sessile polyps were found in the rectum. The                            polyps were 1 to 2 mm in size. These polyps were  removed with a cold biopsy forceps. Resection and                            retrieval were complete.                           Non-bleeding internal hemorrhoids were found during                            retroflexion. The hemorrhoids were medium-sized.                           The exam was otherwise without abnormality. Complications:            No immediate complications. Estimated Blood Loss:     Estimated blood loss was minimal. Impression:               - Mild diverticulosis in the sigmoid colon, in the                            descending colon, in the transverse colon and in                            the ascending colon.                           - Three 1 to 2 mm polyps in the rectum, removed                            with a cold biopsy forceps. Resected and retrieved.                           - Non-bleeding internal hemorrhoids.                           - The examination was  otherwise normal. Recommendation:           - Patient has a contact number available for                            emergencies. The signs and symptoms of potential                            delayed complications were discussed with the                            patient. Return to normal activities tomorrow.                            Written discharge instructions were provided to the                            patient.                           - Resume previous diet.                           -  Continue present medications.                           - Await pathology results.                           - Repeat colonoscopy in 3 - 5 years for                            surveillance based on pathology results.                           - Return to GI clinic at the next available                            appointment. Mauri Pole, MD 03/22/2019 10:33:58 AM This report has been signed electronically.

## 2019-03-23 ENCOUNTER — Other Ambulatory Visit: Payer: Self-pay

## 2019-03-26 ENCOUNTER — Telehealth: Payer: Self-pay

## 2019-03-26 NOTE — Telephone Encounter (Signed)
Covid-19 screening questions   Do you now or have you had a fever in the last 14 days? No. Do you have any respiratory symptoms of shortness of breath or cough now or in the last 14 days? No. Do you have any family members or close contacts with diagnosed or suspected Covid-19 in the past 14 days? No.  Have you been tested for Covid-19 and found to be positive? No.       Follow up Call-  Call back number 03/22/2019  Post procedure Call Back phone  # [phone]  Permission to leave phone message Yes  Some recent data might be hidden     Patient questions:  Do you have a fever, pain , or abdominal swelling? No. Pain Score  0 *  Have you tolerated food without any problems? Yes.    Have you been able to return to your normal activities? Yes.    Do you have any questions about your discharge instructions: Diet   No. Medications  No. Follow up visit  No.  Do you have questions or concerns about your Care? No.  Actions: * If pain score is 4 or above: No action needed, pain <4.

## 2019-03-30 ENCOUNTER — Encounter: Payer: Self-pay | Admitting: Gastroenterology

## 2019-05-01 ENCOUNTER — Ambulatory Visit: Payer: Medicare PPO | Admitting: Gastroenterology

## 2019-05-01 ENCOUNTER — Encounter: Payer: Self-pay | Admitting: Gastroenterology

## 2019-05-01 VITALS — BP 130/68 | HR 71 | Temp 97.9°F | Ht 64.0 in | Wt 130.0 lb

## 2019-05-01 DIAGNOSIS — K2990 Gastroduodenitis, unspecified, without bleeding: Secondary | ICD-10-CM

## 2019-05-01 DIAGNOSIS — K743 Primary biliary cirrhosis: Secondary | ICD-10-CM | POA: Diagnosis not present

## 2019-05-01 DIAGNOSIS — M3219 Other organ or system involvement in systemic lupus erythematosus: Secondary | ICD-10-CM

## 2019-05-01 DIAGNOSIS — K2970 Gastritis, unspecified, without bleeding: Secondary | ICD-10-CM | POA: Diagnosis not present

## 2019-05-01 NOTE — Patient Instructions (Signed)
Take Vit K 3mg  daily for 4 days, then decrease back to 1mg  daily.   You have been scheduled for an abdominal ultrasound at [facility] (1st floor of hospital) on 05/07/2019 at 10:00am. Please arrive 15 minutes prior to your appointment for registration. Make certain not to have anything to eat or drink 6 hours prior to your appointment. Should you need to reschedule your appointment, please contact radiology at [phone]. This test typically takes about 30 minutes to perform.  If you are age 60 or older, your body mass index should be between 23-30. Your Body mass index is 22.31 kg/m. If this is out of the aforementioned range listed, please consider follow up with your Primary Care Provider.  If you are age 38 or younger, your body mass index should be between 19-25. Your Body mass index is 22.31 kg/m. If this is out of the aformentioned range listed, please consider follow up with your Primary Care Provider.    Due to recent changes in healthcare laws, you may see the results of your imaging and laboratory studies on MyChart before your provider has had a chance to review them.  We understand that in some cases there may be results that are confusing or concerning to you. Not all laboratory results come back in the same time frame and the provider may be waiting for multiple results in order to interpret others.  Please give [place] 48 hours in order for your provider to thoroughly review all the results before contacting the office for clarification of your results.   Lab order has been given to you at your request to have labs be done at your primary care office - Dr. Horald Pollen- please have her office to fax results to [phone]  Attn: Dr. Silverio Decamp   We will see you back in the office in 6 months.   Thank you for choosing me and [facility].  Dr. Silverio Decamp
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
## 2019-05-02 ENCOUNTER — Encounter: Payer: Self-pay | Admitting: Gastroenterology

## 2019-05-07 ENCOUNTER — Other Ambulatory Visit: Payer: Self-pay

## 2019-05-07 ENCOUNTER — Ambulatory Visit (HOSPITAL_COMMUNITY)
Admission: RE | Admit: 2019-05-07 | Discharge: 2019-05-07 | Disposition: A | Discharge status: Home / Self Care | Payer: Medicare PPO | Source: Ambulatory Visit | Attending: Gastroenterology | Admitting: Gastroenterology

## 2019-05-07 DIAGNOSIS — K2990 Gastroduodenitis, unspecified, without bleeding: Secondary | ICD-10-CM | POA: Diagnosis present

## 2019-05-07 DIAGNOSIS — K2970 Gastritis, unspecified, without bleeding: Secondary | ICD-10-CM | POA: Diagnosis present

## 2019-05-07 DIAGNOSIS — K743 Primary biliary cirrhosis: Secondary | ICD-10-CM | POA: Insufficient documentation

## 2019-05-15 ENCOUNTER — Other Ambulatory Visit: Payer: Self-pay | Admitting: Obstetrics and Gynecology

## 2019-05-15 DIAGNOSIS — Z1231 Encounter for screening mammogram for malignant neoplasm of breast: Secondary | ICD-10-CM

## 2019-05-16 ENCOUNTER — Ambulatory Visit
Admission: RE | Admit: 2019-05-16 | Discharge: 2019-05-16 | Disposition: A | Discharge status: Home / Self Care | Payer: Medicare PPO | Source: Ambulatory Visit | Attending: Obstetrics and Gynecology | Admitting: Obstetrics and Gynecology

## 2019-05-16 ENCOUNTER — Other Ambulatory Visit: Payer: Self-pay

## 2019-05-16 DIAGNOSIS — Z1231 Encounter for screening mammogram for malignant neoplasm of breast: Secondary | ICD-10-CM

## 2020-06-02 ENCOUNTER — Other Ambulatory Visit: Payer: Self-pay | Admitting: Family Medicine

## 2020-06-02 DIAGNOSIS — E038 Other specified hypothyroidism: Secondary | ICD-10-CM

## 2020-06-23 ENCOUNTER — Ambulatory Visit
Admission: RE | Admit: 2020-06-23 | Discharge: 2020-06-23 | Disposition: A | Discharge status: Home / Self Care | Payer: Medicare PPO | Source: Ambulatory Visit | Attending: Family Medicine | Admitting: Family Medicine

## 2020-06-23 DIAGNOSIS — E038 Other specified hypothyroidism: Secondary | ICD-10-CM

## 2020-11-12 ENCOUNTER — Other Ambulatory Visit: Payer: Self-pay | Admitting: Obstetrics and Gynecology

## 2020-11-12 DIAGNOSIS — Z1231 Encounter for screening mammogram for malignant neoplasm of breast: Secondary | ICD-10-CM

## 2020-11-14 ENCOUNTER — Other Ambulatory Visit: Payer: Self-pay

## 2020-11-14 ENCOUNTER — Ambulatory Visit
Admission: RE | Admit: 2020-11-14 | Discharge: 2020-11-14 | Disposition: A | Discharge status: Home / Self Care | Payer: Medicare PPO | Source: Ambulatory Visit | Attending: Obstetrics and Gynecology | Admitting: Obstetrics and Gynecology

## 2020-11-14 DIAGNOSIS — Z1231 Encounter for screening mammogram for malignant neoplasm of breast: Secondary | ICD-10-CM

## 2021-08-20 ENCOUNTER — Other Ambulatory Visit: Payer: Self-pay | Admitting: Family Medicine

## 2021-08-20 DIAGNOSIS — E2839 Other primary ovarian failure: Secondary | ICD-10-CM

## 2022-01-27 ENCOUNTER — Other Ambulatory Visit: Payer: Self-pay | Admitting: Obstetrics and Gynecology

## 2022-01-27 DIAGNOSIS — N644 Mastodynia: Secondary | ICD-10-CM

## 2022-02-05 ENCOUNTER — Ambulatory Visit
Admission: RE | Admit: 2022-02-05 | Discharge: 2022-02-05 | Disposition: A | Discharge status: Home / Self Care | Payer: Medicare PPO | Source: Ambulatory Visit | Attending: Family Medicine | Admitting: Family Medicine

## 2022-02-05 DIAGNOSIS — E2839 Other primary ovarian failure: Secondary | ICD-10-CM

## 2022-02-08 ENCOUNTER — Other Ambulatory Visit: Payer: Self-pay | Admitting: Obstetrics and Gynecology

## 2022-02-08 ENCOUNTER — Ambulatory Visit
Admission: RE | Admit: 2022-02-08 | Discharge: 2022-02-08 | Disposition: A | Discharge status: Home / Self Care | Payer: Medicare PPO | Source: Ambulatory Visit | Attending: Obstetrics and Gynecology | Admitting: Obstetrics and Gynecology

## 2022-02-08 DIAGNOSIS — N644 Mastodynia: Secondary | ICD-10-CM
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
## 2023-05-02 ENCOUNTER — Ambulatory Visit: Attending: Cardiovascular Disease | Admitting: Cardiovascular Disease

## 2023-05-02 ENCOUNTER — Encounter: Payer: Self-pay | Admitting: Cardiovascular Disease

## 2023-05-02 VITALS — BP 148/76 | HR 57 | Ht 64.0 in | Wt 138.5 lb

## 2023-05-02 DIAGNOSIS — R079 Chest pain, unspecified: Secondary | ICD-10-CM

## 2023-05-02 DIAGNOSIS — R002 Palpitations: Secondary | ICD-10-CM | POA: Diagnosis not present

## 2023-05-02 DIAGNOSIS — I209 Angina pectoris, unspecified: Secondary | ICD-10-CM

## 2023-05-02 MED ORDER — METOPROLOL TARTRATE 25 MG PO TABS: ORAL_TABLET | ORAL | 0 refills | Status: DC

## 2023-05-02 NOTE — Patient Instructions (Addendum)
 Medication Instructions:  Please take the one 25 mg metoprolol 2 hours prior to cardiac CT test.  If you need a refill on your cardiac medications before your next appointment, please call your pharmacy.   Lab work: Your provider would like for you to have following labs drawn today BMP.    Testing/Procedures: Your physician has requested that you have an echocardiogram. Echocardiography is a painless test that uses sound waves to create images of your heart. It provides your doctor with information about the size and shape of your heart and how well your heart's chambers and valves are working.   You may receive an ultrasound enhancing agent through an IV if needed to better visualize your heart during the echo. This procedure takes approximately one hour.  There are no restrictions for this procedure.  This will take place at [address] [facility] [address], [place] [address]  Please note: We ask at that you not bring children with you during ultrasound (echo/ vascular) testing. Due to room size and safety concerns, children are not allowed in the ultrasound rooms during exams. Our front office staff cannot provide observation of children in our lobby area while testing is being conducted. An adult accompanying a patient to their appointment will only be allowed in the ultrasound room at the discretion of the ultrasound technician under special circumstances. We apologize for any inconvenience.     Your cardiac CT will be scheduled at one of the below locations:   [facility] [address] [place], [place] [address] [phone]  OR   [facility] [address] [place], [place] [address] [phone]  If scheduled at [facility] or [facility], please arrive 15 mins early for check-in and test prep.  There is spacious parking and easy access to  the radiology department from the [facility] entrance. Please enter here and check-in with the desk attendant.    Please follow these instructions carefully (unless otherwise directed):  An IV will be required for this test and Nitroglycerin will be given.  Hold all erectile dysfunction medications at least 3 days (72 hrs) prior to test. (Ie viagra, cialis, sildenafil, tadalafil, etc)   On the Night Before the Test: Be sure to Drink plenty of water. Do not consume any caffeinated/decaffeinated beverages or chocolate 12 hours prior to your test. Do not take any antihistamines 12 hours prior to your test.  On the Day of the Test: Drink plenty of water until 1 hour prior to the test. Do not eat any food 1 hour prior to test. You may take your regular medications prior to the test.  Take metoprolol (Lopressor) two hours prior to test. If you take Furosemide/Hydrochlorothiazide/Spironolactone/Chlorthalidone, please HOLD on the morning of the test. Patients who wear a continuous glucose monitor MUST remove the device prior to scanning. FEMALES- please wear underwire-free bra if available, avoid dresses & tight clothing  After the Test: Drink plenty of water. After receiving IV contrast, you may experience a mild flushed feeling. This is normal. On occasion, you may experience a mild rash up to 24 hours after the test. This is not dangerous. If this occurs, you can take Benadryl 25 mg, Zyrtec, Claritin, or Allegra and increase your fluid intake. (Patients taking Tikosyn should avoid Benadryl, and may take Zyrtec, Claritin, or Allegra) If you experience trouble breathing, this can be serious. If it is severe call 911  IMMEDIATELY. If it is mild, please call our office.  We will call to schedule your test 2-4 weeks out understanding that some insurance companies will need an authorization prior to the service being performed.   For more information and frequently asked questions,  please visit our website : [email or web address]  For non-scheduling related questions, please contact the cardiac imaging nurse navigator should you have any questions/concerns: Cardiac Imaging Nurse Navigators Direct Office Dial: [phone]   For scheduling needs, including cancellations and rescheduling, please call [place], [phone].   Follow-Up: At [facility], you and your health needs are our priority.  As part of our continuing mission to provide you with exceptional heart care, we have created designated Provider Care Teams.  These Care Teams include your primary Cardiologist (physician) and Advanced Practice Providers (APPs -  Physician Assistants and Nurse Practitioners) who all work together to provide you with the care you need, when you need it.  You will need a follow up appointment in 12 months  Providers on your designated Care Team:   Laneta Pintos, NP Varney Gentleman, PA-C Cadence Gennaro Khat, [place]  COVID-19 Vaccine Information can be found at: [email or web address] For questions related to vaccine distribution or appointments, please email [email or web address] or call [phone].

## 2023-05-03 LAB — BASIC METABOLIC PANEL WITH GFR
BUN/Creatinine Ratio: 11 — ABNORMAL LOW (ref 12–28)
BUN: 7 mg/dL — ABNORMAL LOW (ref 8–27)
CO2: 20 mmol/L (ref 20–29)
Calcium: 9.4 mg/dL (ref 8.7–10.3)
Chloride: 97 mmol/L (ref 96–106)
Creatinine, Ser: 0.66 mg/dL (ref 0.57–1.00)
Glucose: 97 mg/dL (ref 70–99)
Potassium: 4.5 mmol/L (ref 3.5–5.2)
Sodium: 134 mmol/L (ref 134–144)
eGFR: 93 mL/min/{1.73_m2} (ref >59)

## 2023-05-13 ENCOUNTER — Encounter: Payer: Self-pay | Admitting: Cardiovascular Disease

## 2023-05-16 ENCOUNTER — Ambulatory Visit: Admitting: Cardiology

## 2023-05-24 ENCOUNTER — Telehealth (HOSPITAL_COMMUNITY): Payer: Self-pay | Admitting: *Deleted

## 2023-05-24 NOTE — Telephone Encounter (Signed)
 Attempted to call patient regarding upcoming cardiac CT appointment. Left message on voicemail with name and callback number  Larey Brick RN Navigator Cardiac Imaging [facility] [phone] Office [phone] Cell

## 2023-05-24 NOTE — Telephone Encounter (Signed)
 Reaching out to patient to offer assistance regarding upcoming cardiac imaging study; pt verbalizes understanding of appt date/time, parking situation and where to check in, pre-test NPO status and verified current allergies; name and call back number provided for further questions should they arise  Chase Copping RN Navigator Cardiac Imaging [facility] [phone] office [phone] cell  Patient is aware to arrive at 12:45 pm.

## 2023-05-25 ENCOUNTER — Ambulatory Visit (HOSPITAL_COMMUNITY)
Admission: RE | Admit: 2023-05-25 | Discharge: 2023-05-25 | Disposition: A | Discharge status: Home / Self Care | Source: Ambulatory Visit | Attending: Cardiovascular Disease | Admitting: Cardiovascular Disease

## 2023-05-25 DIAGNOSIS — R079 Chest pain, unspecified: Secondary | ICD-10-CM | POA: Diagnosis present

## 2023-05-25 DIAGNOSIS — I2510 Atherosclerotic heart disease of native coronary artery without angina pectoris: Secondary | ICD-10-CM | POA: Insufficient documentation

## 2023-05-25 MED ORDER — DILTIAZEM HCL 25 MG/5ML IV SOLN: 10.0000 mg | INTRAVENOUS | Status: DC | PRN

## 2023-05-25 MED ORDER — IOHEXOL 350 MG/ML SOLN: 100.0000 mL | Freq: Once | INTRAVENOUS | Status: DC | PRN

## 2023-05-25 MED ORDER — NITROGLYCERIN 0.4 MG SL SUBL
0.8000 mg | SUBLINGUAL_TABLET | Freq: Once | SUBLINGUAL | Status: AC
  Administered 2023-05-25: 0.8 mg via SUBLINGUAL

## 2023-05-25 MED ORDER — METOPROLOL TARTRATE 5 MG/5ML IV SOLN: 10.0000 mg | INTRAVENOUS | Status: DC | PRN

## 2023-05-25 MED ORDER — NITROGLYCERIN 0.4 MG SL SUBL
SUBLINGUAL_TABLET | SUBLINGUAL | Status: AC
  Filled 2023-05-25: qty 2

## 2023-05-25 MED ORDER — IOHEXOL 350 MG/ML SOLN
100.0000 mL | Freq: Once | INTRAVENOUS | Status: AC | PRN
  Administered 2023-05-25: 100 mL via INTRAVENOUS

## 2023-05-26 ENCOUNTER — Other Ambulatory Visit: Payer: Self-pay | Admitting: Cardiology

## 2023-05-26 ENCOUNTER — Ambulatory Visit (HOSPITAL_COMMUNITY)
Admission: RE | Admit: 2023-05-26 | Discharge: 2023-05-26 | Disposition: A | Discharge status: Home / Self Care | Source: Ambulatory Visit | Attending: Cardiology | Admitting: Cardiology

## 2023-05-26 DIAGNOSIS — I2510 Atherosclerotic heart disease of native coronary artery without angina pectoris: Secondary | ICD-10-CM

## 2023-05-26 DIAGNOSIS — R931 Abnormal findings on diagnostic imaging of heart and coronary circulation: Secondary | ICD-10-CM | POA: Diagnosis present

## 2023-05-29 ENCOUNTER — Encounter: Payer: Self-pay | Admitting: Cardiovascular Disease

## 2023-05-30 ENCOUNTER — Telehealth: Payer: Self-pay | Admitting: Cardiovascular Disease

## 2023-05-30 NOTE — Telephone Encounter (Signed)
 Called and spoke with patient. Explained to patient that when she comes to clinic that the heart catheterization will be scheduled at that time. Patient verbalizes understanding. Patient also requesting that her last office note and CT results be faxed to Dr. Kurt Phi at [phone]. Records faxed per patient request.

## 2023-05-30 NOTE — Telephone Encounter (Signed)
  Pt would like to schedule her heart cath with Dr. Addie Holstein, she would like to check in his first available to schedule the procedure with him. Also, pt would  like to request to send recent OV notes and CT result to her PCP

## 2023-05-31 ENCOUNTER — Telehealth: Payer: Self-pay | Admitting: Cardiovascular Disease

## 2023-05-31 ENCOUNTER — Ambulatory Visit: Attending: Cardiovascular Disease

## 2023-05-31 DIAGNOSIS — I209 Angina pectoris, unspecified: Secondary | ICD-10-CM | POA: Diagnosis not present

## 2023-05-31 DIAGNOSIS — R079 Chest pain, unspecified: Secondary | ICD-10-CM

## 2023-05-31 LAB — ECHOCARDIOGRAM COMPLETE
AR max vel: 2.4 cm2
AV Area VTI: 2.25 cm2
AV Area mean vel: 2.31 cm2
AV Mean grad: 5 mmHg
AV Peak grad: 8.2 mmHg
Ao pk vel: 1.43 m/s
Area-P 1/2: 3.99 cm2
Calc EF: 66.2 %
S' Lateral: 2.64 cm
Single Plane A2C EF: 63.6 %
Single Plane A4C EF: 67.6 %

## 2023-05-31 NOTE — Telephone Encounter (Signed)
 Called patient and informed her that the one time dose of Metoprolol  was for her CT scan not Echocardiogram. Patient verbalized understanding.

## 2023-05-31 NOTE — Telephone Encounter (Signed)
 Pt c/o medication issue:  1. Name of Medication:   metoprolol  tartrate (LOPRESSOR ) 25 MG tablet    2. How are you currently taking this medication (dosage and times per day)?   TAKE 1 TABLET 2 HR PRIOR TO CARDIAC PROCEDURE    3. Are you having a reaction (difficulty breathing--STAT)? No  4. What is your medication issue? Patient is calling for clarification if she needs to take this medication before having her Echo today. Please advise.
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
## 2023-06-05 ENCOUNTER — Ambulatory Visit: Payer: Self-pay | Admitting: Cardiovascular Disease

## 2023-06-06 ENCOUNTER — Telehealth: Payer: Self-pay | Admitting: Cardiovascular Disease

## 2023-06-06 ENCOUNTER — Other Ambulatory Visit: Payer: Self-pay | Admitting: Cardiovascular Disease

## 2023-06-06 ENCOUNTER — Ambulatory Visit: Attending: Cardiovascular Disease | Admitting: Cardiovascular Disease

## 2023-06-06 ENCOUNTER — Encounter: Payer: Self-pay | Admitting: Cardiovascular Disease

## 2023-06-06 VITALS — BP 140/70 | HR 55 | Ht 64.0 in | Wt 132.0 lb

## 2023-06-06 DIAGNOSIS — I10 Essential (primary) hypertension: Secondary | ICD-10-CM | POA: Diagnosis not present

## 2023-06-06 DIAGNOSIS — Z79899 Other long term (current) drug therapy: Secondary | ICD-10-CM | POA: Diagnosis not present

## 2023-06-06 DIAGNOSIS — I209 Angina pectoris, unspecified: Secondary | ICD-10-CM

## 2023-06-06 DIAGNOSIS — R002 Palpitations: Secondary | ICD-10-CM

## 2023-06-06 DIAGNOSIS — I25118 Atherosclerotic heart disease of native coronary artery with other forms of angina pectoris: Secondary | ICD-10-CM

## 2023-06-06 NOTE — Patient Instructions (Addendum)
 Medication Instructions:  Aspirin 81 mg daily Crestor 20 mg daily  If you need a refill on your cardiac medications before your next appointment, please call your pharmacy.   Lab work: No new labs needed  Testing/Procedures: Cardiac cath at [facility] for positive cardiac CTA on wednesday   [name] [name]  Julia Day  06/06/2023  You are scheduled for a Cardiac Catheterization on Wednesday, May 21 with Dr. Antionette Day.  1. Please arrive at the [facility] (Main Entrance A) at [facility]: [address] [place], [place] [address] at 9:30 AM (This time is 2 hour(s) before your procedure to ensure your preparation).   Free valet parking service is available. You will check in at ADMITTING. The support person will be asked to wait in the waiting room.  It is OK to have someone drop you off and come back when you are ready to be discharged.    Special note: Every effort is made to have your procedure done on time. Please understand that emergencies sometimes delay scheduled procedures.  2. Diet: Do not eat solid foods after midnight.  The patient may have clear liquids until 5am upon the day of the procedure.  3. Labs: You will need to have blood drawn on Monday, May 19 at [facility] Entrance, Go to 1st desk on your right to register.  Address: [address] [place], [place] [address]  Open: 8am - 5pm  Phone: [phone]. You do not need to be fasting.  4. Medication instructions in preparation for your procedure:   Contrast Allergy: No  5. Plan to go home the same day, you will only stay overnight if medically necessary. 6. Bring a current list of your medications and current insurance cards. 7. You MUST have a responsible person to drive you home. 8. Someone MUST be with you the  first 24 hours after you arrive home or your discharge will be delayed. 9. Please wear clothes that are easy to get on and off and wear slip-on shoes.  Thank you for allowing us  to care for you!   --  Invasive Cardiovascular services   Follow-Up: At [facility], you and your health needs are our priority.  As part of our continuing mission to provide you with exceptional heart care, we have created designated Provider Care Teams.  These Care Teams include your primary Cardiologist (physician) and Advanced Practice Providers (APPs -  Physician Assistants and Nurse Practitioners) who all work together to provide you with the care you need, when you need it.  You will need a follow up appointment in 1 month  Providers on your designated Care Team:   Julia Pintos, NP Julia Gentleman, PA-C Julia Day, [place]  COVID-19 Vaccine Information can be found at: [email or web address] For questions related to vaccine distribution or appointments, please email vaccine@[email or web address] or call [phone].

## 2023-06-06 NOTE — Telephone Encounter (Signed)
 Patient is requesting for the office visit notes from today be faxed to Dr. Debi Fall office. Patient would like her PCP to be aware and informed of everything that was discussed today. Please advise.

## 2023-06-07 ENCOUNTER — Telehealth: Payer: Self-pay | Admitting: *Deleted

## 2023-06-07 ENCOUNTER — Ambulatory Visit: Payer: Self-pay | Admitting: Emergency Medicine

## 2023-06-07 LAB — BASIC METABOLIC PANEL WITH GFR
BUN/Creatinine Ratio: 8 — ABNORMAL LOW (ref 12–28)
BUN: 5 mg/dL — ABNORMAL LOW (ref 8–27)
CO2: 20 mmol/L (ref 20–29)
Calcium: 9.4 mg/dL (ref 8.7–10.3)
Chloride: 97 mmol/L (ref 96–106)
Creatinine, Ser: 0.61 mg/dL (ref 0.57–1.00)
Glucose: 103 mg/dL — ABNORMAL HIGH (ref 70–99)
Potassium: 4.4 mmol/L (ref 3.5–5.2)
Sodium: 134 mmol/L (ref 134–144)
eGFR: 95 mL/min/{1.73_m2} (ref >59)

## 2023-06-07 LAB — CBC
Hematocrit: 42.2 % (ref 34.0–46.6)
Hemoglobin: 13.6 g/dL (ref 11.1–15.9)
MCH: 30.8 pg (ref 26.6–33.0)
MCHC: 32.2 g/dL (ref 31.5–35.7)
MCV: 96 fL (ref 79–97)
Platelets: 252 10*3/uL (ref 150–450)
RBC: 4.42 x10E6/uL (ref 3.77–5.28)
RDW: 13.2 % (ref 11.7–15.4)
WBC: 6 10*3/uL (ref 3.4–10.8)

## 2023-06-07 NOTE — Telephone Encounter (Signed)
 Patient is returning call.

## 2023-06-07 NOTE — Telephone Encounter (Signed)
 Reviewed procedure instructions with patient.

## 2023-06-07 NOTE — Telephone Encounter (Addendum)
 Cardiac Catheterization scheduled at [facility] for: Wednesday Jun 08, 2023 11:30 AM Arrival time [facility] Main Entrance A at: 9:30 AM  Nothing to eat after midnight prior to procedure, clear liquids until 5 AM day of procedure.  Medication instructions: -Usual morning medications can be taken with sips of water including aspirin 81 mg.  Plan to go home the same day, you will only stay overnight if medically necessary.  You must have responsible adult to drive you home.  Someone must be with you the first 24 hours after you arrive home.  Left message for patient to call back to review instructions

## 2023-06-08 ENCOUNTER — Other Ambulatory Visit: Payer: Self-pay

## 2023-06-08 ENCOUNTER — Encounter (HOSPITAL_COMMUNITY)
Admission: RE | Disposition: A | Discharge status: Home / Self Care | Payer: Self-pay | Source: Ambulatory Visit | Attending: Cardiovascular Disease

## 2023-06-08 ENCOUNTER — Ambulatory Visit (HOSPITAL_COMMUNITY)
Admission: RE | Admit: 2023-06-08 | Discharge: 2023-06-08 | Disposition: A | Discharge status: Home / Self Care | Source: Ambulatory Visit | Attending: Cardiovascular Disease | Admitting: Cardiovascular Disease

## 2023-06-08 DIAGNOSIS — Z8349 Family history of other endocrine, nutritional and metabolic diseases: Secondary | ICD-10-CM | POA: Insufficient documentation

## 2023-06-08 DIAGNOSIS — I10 Essential (primary) hypertension: Secondary | ICD-10-CM | POA: Diagnosis not present

## 2023-06-08 DIAGNOSIS — F419 Anxiety disorder, unspecified: Secondary | ICD-10-CM | POA: Diagnosis not present

## 2023-06-08 DIAGNOSIS — R931 Abnormal findings on diagnostic imaging of heart and coronary circulation: Secondary | ICD-10-CM | POA: Diagnosis not present

## 2023-06-08 DIAGNOSIS — Z79899 Other long term (current) drug therapy: Secondary | ICD-10-CM | POA: Insufficient documentation

## 2023-06-08 DIAGNOSIS — R002 Palpitations: Secondary | ICD-10-CM | POA: Diagnosis not present

## 2023-06-08 DIAGNOSIS — I25118 Atherosclerotic heart disease of native coronary artery with other forms of angina pectoris: Secondary | ICD-10-CM

## 2023-06-08 DIAGNOSIS — I209 Angina pectoris, unspecified: Secondary | ICD-10-CM | POA: Diagnosis present

## 2023-06-08 DIAGNOSIS — I25119 Atherosclerotic heart disease of native coronary artery with unspecified angina pectoris: Secondary | ICD-10-CM | POA: Insufficient documentation

## 2023-06-08 HISTORY — PX: LEFT HEART CATH AND CORONARY ANGIOGRAPHY: CATH118249

## 2023-06-08 SURGERY — LEFT HEART CATH AND CORONARY ANGIOGRAPHY
Anesthesia: LOCAL

## 2023-06-08 MED ORDER — SODIUM CHLORIDE 0.9 % WEIGHT BASED INFUSION: 3.0000 mL/kg/h | INTRAVENOUS | Status: AC

## 2023-06-08 MED ORDER — HEPARIN (PORCINE) IN NACL 1000-0.9 UT/500ML-% IV SOLN
INTRAVENOUS | Status: DC | PRN
  Administered 2023-06-08: 1000 mL via SURGICAL_CAVITY

## 2023-06-08 MED ORDER — LIDOCAINE HCL (PF) 1 % IJ SOLN
INTRAMUSCULAR | Status: AC
  Filled 2023-06-08: qty 30

## 2023-06-08 MED ORDER — FENTANYL CITRATE (PF) 100 MCG/2ML IJ SOLN
INTRAMUSCULAR | Status: DC | PRN
  Administered 2023-06-08: 25 ug via INTRAVENOUS

## 2023-06-08 MED ORDER — SODIUM CHLORIDE 0.9 % WEIGHT BASED INFUSION
1.0000 mL/kg/h | INTRAVENOUS | Status: DC
Start: 2023-06-08 — End: 2023-06-08

## 2023-06-08 MED ORDER — HEPARIN SODIUM (PORCINE) 1000 UNIT/ML IJ SOLN
INTRAMUSCULAR | Status: AC
  Filled 2023-06-08: qty 10

## 2023-06-08 MED ORDER — MIDAZOLAM HCL 2 MG/2ML IJ SOLN
INTRAMUSCULAR | Status: DC | PRN
  Administered 2023-06-08: 1 mg via INTRAVENOUS

## 2023-06-08 MED ORDER — LIDOCAINE HCL (PF) 1 % IJ SOLN
INTRAMUSCULAR | Status: DC | PRN
  Administered 2023-06-08: 2 mL

## 2023-06-08 MED ORDER — HEPARIN SODIUM (PORCINE) 1000 UNIT/ML IJ SOLN
INTRAMUSCULAR | Status: DC | PRN
  Administered 2023-06-08: 3000 [IU] via INTRAVENOUS

## 2023-06-08 MED ORDER — VERAPAMIL HCL 2.5 MG/ML IV SOLN
INTRAVENOUS | Status: AC
  Filled 2023-06-08: qty 2

## 2023-06-08 MED ORDER — VERAPAMIL HCL 2.5 MG/ML IV SOLN
INTRAVENOUS | Status: DC | PRN
  Administered 2023-06-08: 10 mL via INTRA_ARTERIAL

## 2023-06-08 MED ORDER — MIDAZOLAM HCL 2 MG/2ML IJ SOLN
INTRAMUSCULAR | Status: AC
  Filled 2023-06-08: qty 2

## 2023-06-08 MED ORDER — IOHEXOL 350 MG/ML SOLN
INTRAVENOUS | Status: DC | PRN
  Administered 2023-06-08: 35 mL via INTRA_ARTERIAL

## 2023-06-08 MED ORDER — ASPIRIN 81 MG PO CHEW: 81.0000 mg | CHEWABLE_TABLET | ORAL | Status: DC

## 2023-06-08 MED ORDER — FENTANYL CITRATE (PF) 100 MCG/2ML IJ SOLN
INTRAMUSCULAR | Status: AC
  Filled 2023-06-08: qty 2

## 2023-06-08 SURGICAL SUPPLY — 8 items
CATH INFINITI 5 FR JL3.5 (CATHETERS) IMPLANT
CATH INFINITI AMBI 5FR JK (CATHETERS) IMPLANT
DEVICE RAD TR BAND REGULAR (VASCULAR PRODUCTS) IMPLANT
ELECT DEFIB PAD ADLT CADENCE (PAD) IMPLANT
GLIDESHEATH SLEND SS 6F .021 (SHEATH) IMPLANT
GUIDEWIRE INQWIRE 1.5J.035X260 (WIRE) IMPLANT
PACK CARDIAC CATHETERIZATION (CUSTOM PROCEDURE TRAY) ×1 IMPLANT
SET ATX-X65L (MISCELLANEOUS) IMPLANT

## 2023-06-08 NOTE — Progress Notes (Signed)
 Tr band removed at 1355, gauze dressing applied. Right radial level 0, clean, dry, and intact.

## 2023-06-08 NOTE — Discharge Instructions (Signed)

## 2023-06-08 NOTE — Interval H&P Note (Signed)
 Cath Lab Visit (complete for each Cath Lab visit)  Clinical Evaluation Leading to the Procedure:   ACS: No.  Non-ACS:    Anginal Classification: CCS III  Anti-ischemic medical therapy: Minimal Therapy (1 class of medications)  Non-Invasive Test Results: High-risk stress test findings: cardiac mortality >3%/year  Prior CABG: No previous CABG      History and Physical Interval Note:  06/08/2023 11:14 AM  Julia Day  has presented today for surgery, with the diagnosis of positive cardiac CTA.  The various methods of treatment have been discussed with the patient and family. After consideration of risks, benefits and other options for treatment, the patient has consented to  Procedure(s): LEFT HEART CATH AND CORONARY ANGIOGRAPHY (N/A) as a surgical intervention.  The patient's history has been reviewed, patient examined, no change in status, stable for surgery.  I have reviewed the patient's chart and labs.  Questions were answered to the patient's satisfaction.     Julia Day

## 2023-06-09 ENCOUNTER — Encounter (HOSPITAL_COMMUNITY): Payer: Self-pay | Admitting: Cardiovascular Disease

## 2023-06-14 NOTE — Telephone Encounter (Signed)
 Spoke with patient and notified her that both were faxed over to her PCP office. She was appreciative with no further needs.

## 2023-06-14 NOTE — Telephone Encounter (Signed)
 Patient is calling back asking that a copy of her echo and cath be faxed over as well. Please advise

## 2023-07-01 IMAGING — MG MM DIGITAL SCREENING BILAT W/ TOMO AND CAD
8 series · 9 of 24 positions shown · non-contrast
Comparison: Previous exam(s).

CLINICAL DATA: Screening.

EXAM:
DIGITAL SCREENING BILATERAL MAMMOGRAM WITH TOMOSYNTHESIS AND CAD
TECHNIQUE: Bilateral screening digital craniocaudal and mediolateral oblique
mammograms were obtained. Bilateral screening digital breast
tomosynthesis was performed. The images were evaluated with
computer-aided detection.

[R CC synth-2D]
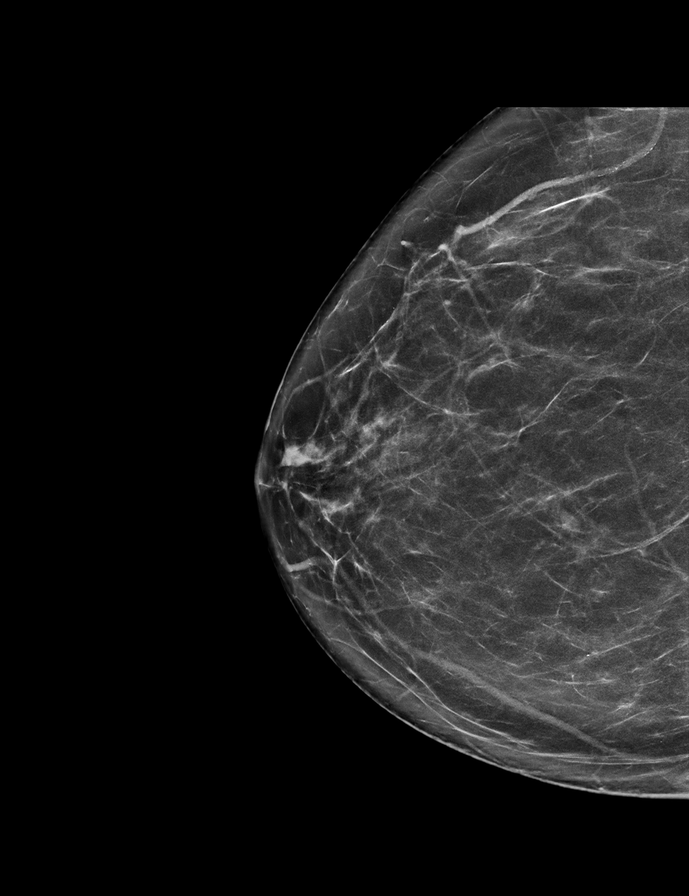

[L CC synth-2D]
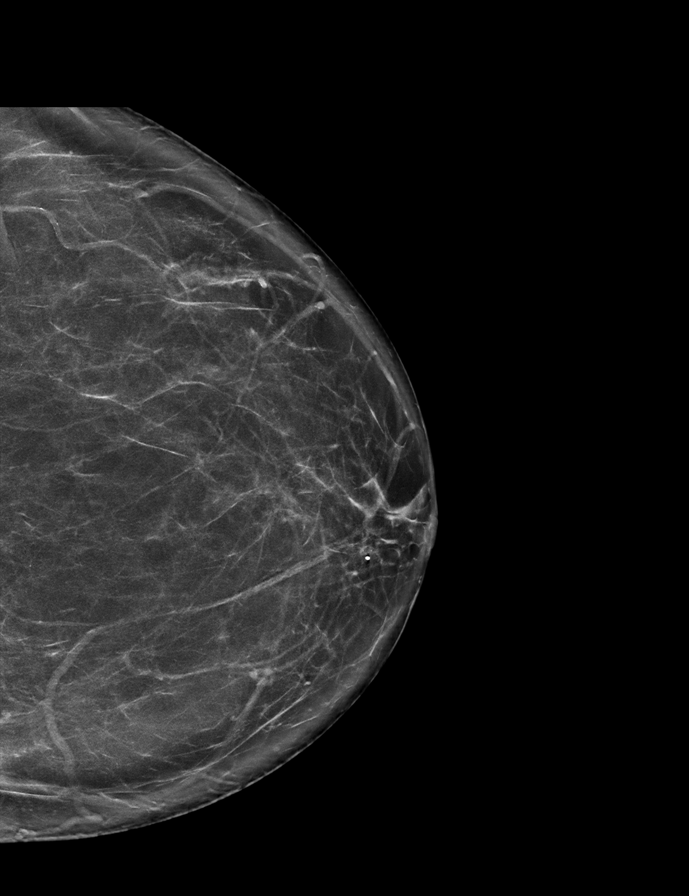

[R MLO synth-2D]
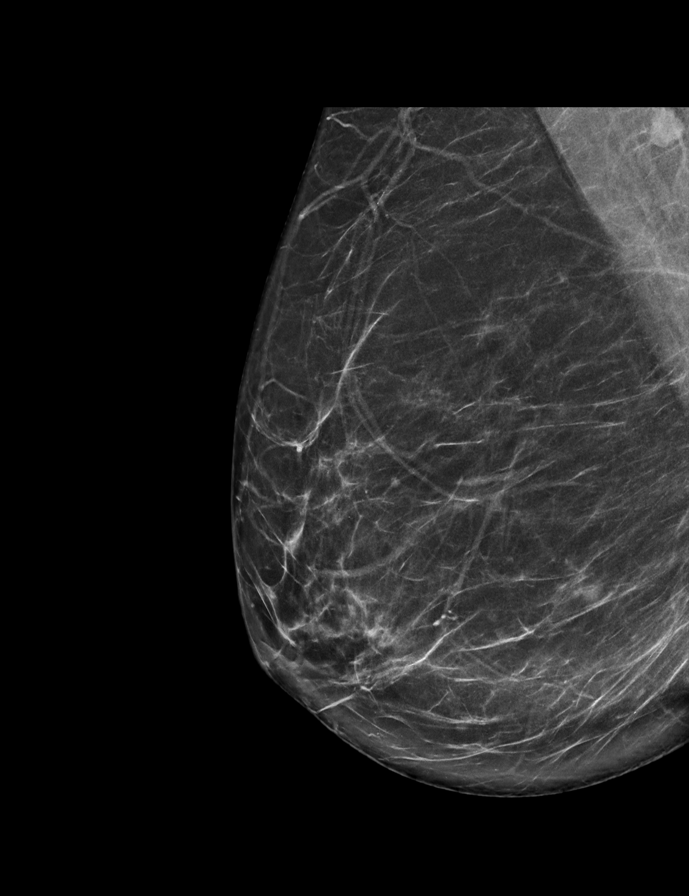

[L MLO synth-2D]
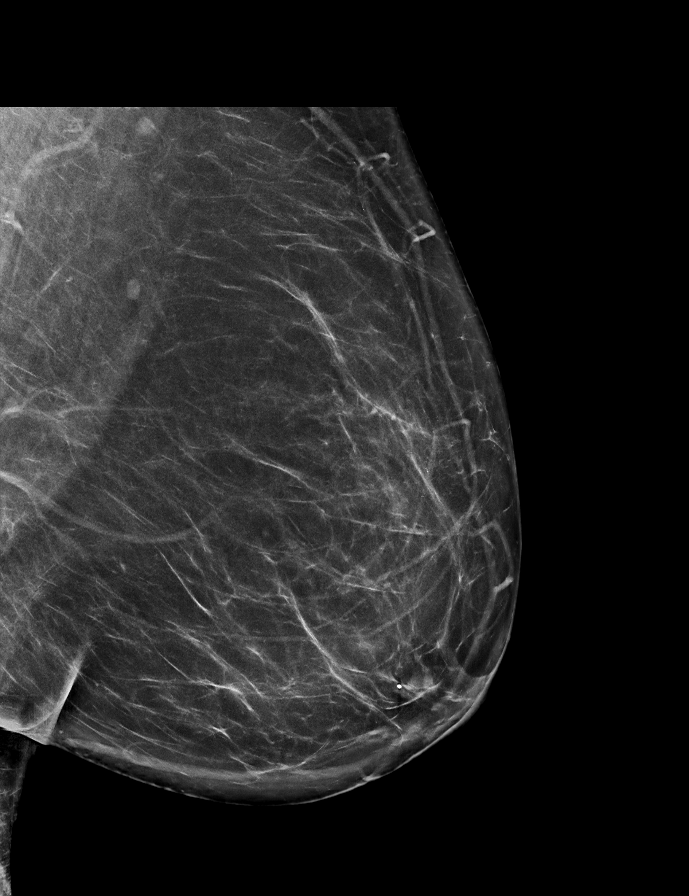

[R CC tomo · 2 of 70 frames shown]
[frame 23/70]
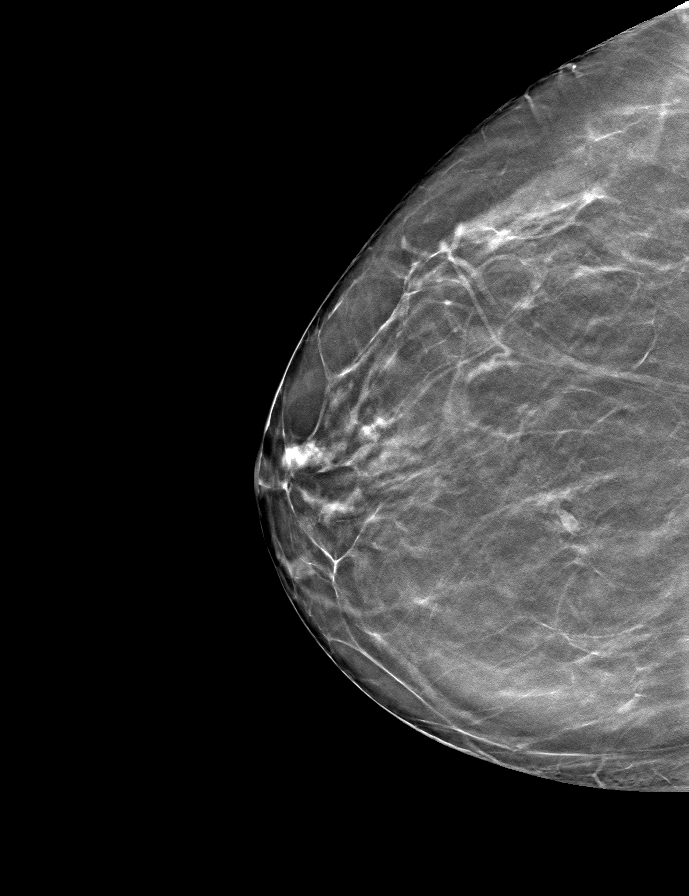
[frame 35/70]
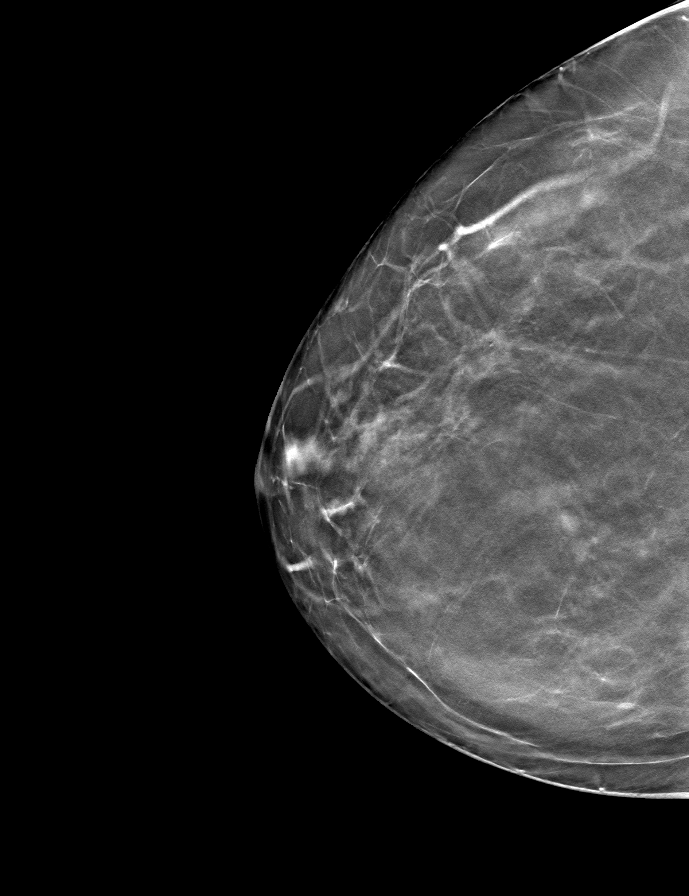

[L CC tomo · tomo slice 37/72.0]
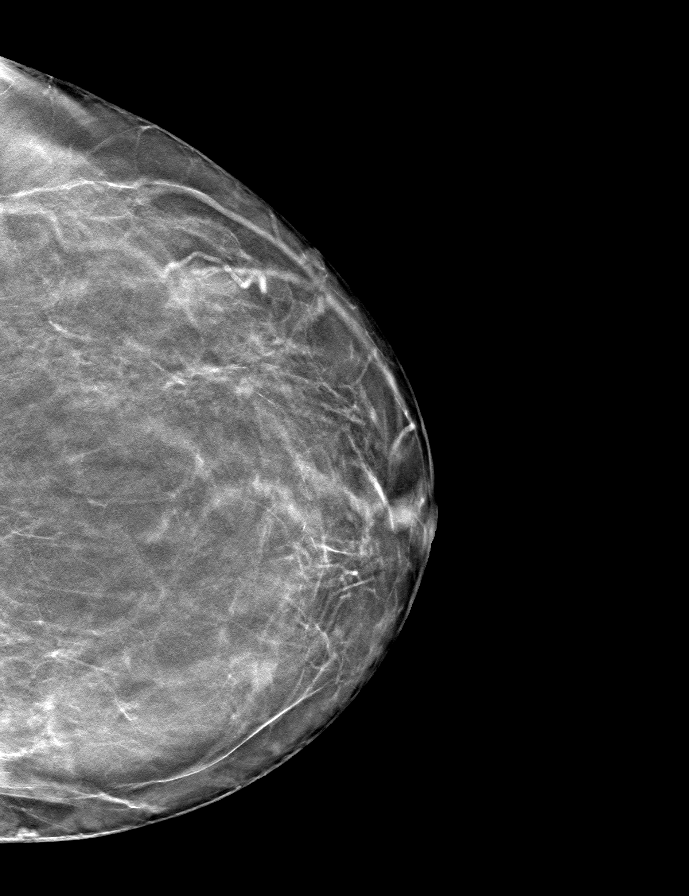

[R MLO tomo · tomo slice 37/74.0]
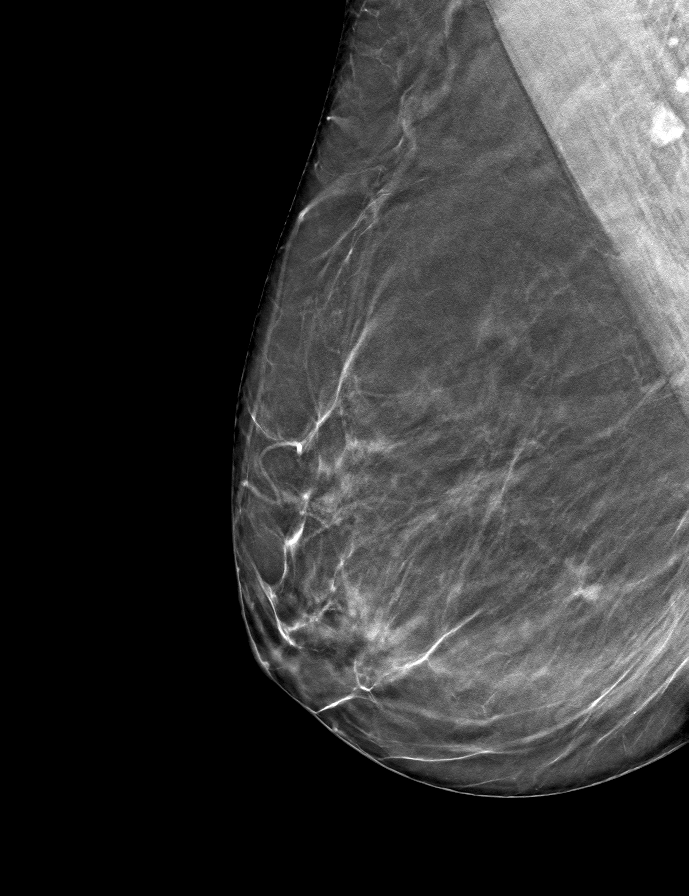

[L MLO tomo · tomo slice 39/78.0]
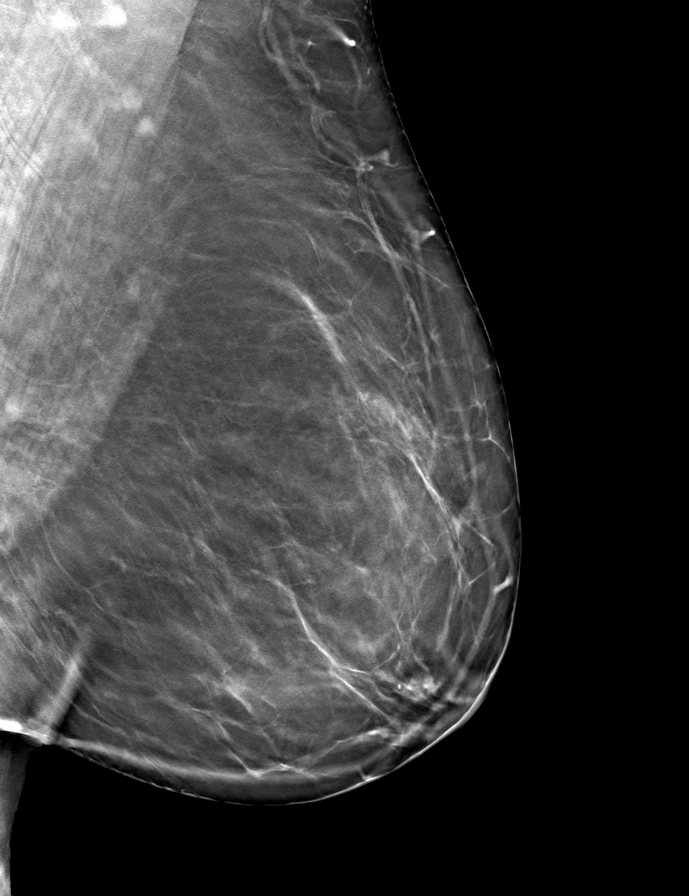

[9 of 24 positions shown; findings below may reference images not displayed]

ACR Breast Density Category b: There are scattered areas of
fibroglandular density.
FINDINGS: There are no findings suspicious for malignancy.
IMPRESSION: No mammographic evidence of malignancy. A result letter of this
screening mammogram will be mailed directly to the patient.

RECOMMENDATION:
Screening mammogram in one year. (Code:51-O-LD2)

BI-RADS CATEGORY  1: Negative.
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
## 2023-07-11 ENCOUNTER — Ambulatory Visit: Attending: Physician Assistant | Admitting: Physician Assistant

## 2023-07-11 ENCOUNTER — Encounter: Payer: Self-pay | Admitting: Physician Assistant

## 2023-07-11 ENCOUNTER — Other Ambulatory Visit: Payer: Self-pay | Admitting: Medical Genetics

## 2023-07-11 VITALS — BP 134/70 | HR 62 | Ht 64.0 in | Wt 135.0 lb

## 2023-07-11 DIAGNOSIS — E785 Hyperlipidemia, unspecified: Secondary | ICD-10-CM | POA: Diagnosis not present

## 2023-07-11 DIAGNOSIS — Z79899 Other long term (current) drug therapy: Secondary | ICD-10-CM

## 2023-07-11 DIAGNOSIS — I10 Essential (primary) hypertension: Secondary | ICD-10-CM | POA: Diagnosis not present

## 2023-07-11 DIAGNOSIS — I25118 Atherosclerotic heart disease of native coronary artery with other forms of angina pectoris: Secondary | ICD-10-CM

## 2023-07-11 NOTE — Patient Instructions (Signed)
 Medication Instructions:  Your physician recommends that you continue on your current medications as directed. Please refer to the Current Medication list given to you today.   *If you need a refill on your cardiac medications before your next appointment, please call your pharmacy*  Lab Work: Your provider would like for you to return in 08/31/2023 to have the following labs drawn: Lipid panel and Liver function test.   Please go to [facility] [address] ([facility] [address], [place] [address] You do not need an appointment.  They are open from 8 am- 4:30 pm.  Lunch from 1:00 pm- 2:00 pm You DO need to be fasting.   If you have labs (blood work) drawn today and your tests are completely normal, you will receive your results only by: MyChart Message (if you have MyChart) OR A paper copy in the mail If you have any lab test that is abnormal or we need to change your treatment, we will call you to review the results.  Follow-Up: At [facility], you and your health needs are our priority.  As part of our continuing mission to provide you with exceptional heart care, our providers are all part of one team.  This team includes your primary Cardiologist (physician) and Advanced Practice Providers or APPs (Physician Assistants and Nurse Practitioners) who all work together to provide you with the care you need, when you need it.  Your next appointment:   After 09/07/2023   Provider:   You may see Timothy Gollan, MD or Bernardino Bring, PA-C

## 2023-07-14 ENCOUNTER — Ambulatory Visit: Admitting: Internal Medicine

## 2023-07-14 ENCOUNTER — Other Ambulatory Visit (HOSPITAL_COMMUNITY)

## 2023-07-27 ENCOUNTER — Other Ambulatory Visit (HOSPITAL_COMMUNITY)

## 2023-08-02 ENCOUNTER — Other Ambulatory Visit: Payer: Self-pay | Admitting: Medical Genetics

## 2023-08-02 DIAGNOSIS — Z006 Encounter for examination for normal comparison and control in clinical research program: Secondary | ICD-10-CM

## 2023-08-15 LAB — GENECONNECT MOLECULAR SCREEN: Genetic Analysis Overall Interpretation: NEGATIVE

## 2023-09-03 ENCOUNTER — Ambulatory Visit: Payer: Self-pay | Admitting: Physician Assistant

## 2023-09-03 LAB — HEPATIC FUNCTION PANEL
ALT: 19 IU/L (ref 0–32)
AST: 32 IU/L (ref 0–40)
Albumin: 4.1 g/dL (ref 3.8–4.8)
Alkaline Phosphatase: 70 IU/L (ref 44–121)
Bilirubin Total: 0.5 mg/dL (ref 0.0–1.2)
Bilirubin, Direct: 0.19 mg/dL (ref 0.00–0.40)
Total Protein: 6.9 g/dL (ref 6.0–8.5)

## 2023-09-03 LAB — LIPID PANEL
Chol/HDL Ratio: 2.6 ratio (ref 0.0–4.4)
Cholesterol, Total: 115 mg/dL (ref 100–199)
HDL: 44 mg/dL (ref >39)
LDL Chol Calc (NIH): 56 mg/dL (ref 0–99)
Triglycerides: 74 mg/dL (ref 0–149)
VLDL Cholesterol Cal: 15 mg/dL (ref 5–40)
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
## 2023-09-08 ENCOUNTER — Encounter: Payer: Self-pay | Admitting: Physician Assistant

## 2023-09-08 ENCOUNTER — Ambulatory Visit: Attending: Physician Assistant | Admitting: Physician Assistant

## 2023-09-08 VITALS — BP 115/62 | HR 75 | Ht 64.0 in | Wt 132.8 lb

## 2023-09-08 DIAGNOSIS — I10 Essential (primary) hypertension: Secondary | ICD-10-CM | POA: Diagnosis not present

## 2023-09-08 DIAGNOSIS — E785 Hyperlipidemia, unspecified: Secondary | ICD-10-CM

## 2023-09-08 DIAGNOSIS — I2510 Atherosclerotic heart disease of native coronary artery without angina pectoris: Secondary | ICD-10-CM

## 2023-09-08 DIAGNOSIS — I25118 Atherosclerotic heart disease of native coronary artery with other forms of angina pectoris: Secondary | ICD-10-CM

## 2023-09-08 NOTE — Patient Instructions (Signed)
 Medication Instructions:  Your physician recommends that you continue on your current medications as directed. Please refer to the Current Medication list given to you today.   *If you need a refill on your cardiac medications before your next appointment, please call your pharmacy*  Lab Work: None ordered at this time   Follow-Up: At [facility], you and your health needs are our priority.  As part of our continuing mission to provide you with exceptional heart care, our providers are all part of one team.  This team includes your primary Cardiologist (physician) and Advanced Practice Providers or APPs (Physician Assistants and Nurse Practitioners) who all work together to provide you with the care you need, when you need it.  Your next appointment:   6 month(s)  Provider:   You may see Timothy Gollan, MD or Varney Gentleman, PA-C

## 2024-03-06 ENCOUNTER — Ambulatory Visit: Admitting: Physician Assistant
